# Patient Record
Sex: Female | Born: 1980 | Race: Black or African American | Hispanic: No | Smoking: Former smoker
Health system: Southern US, Community
[De-identification: ages and names within clinical notes are randomized; demographics above are authoritative.]

## PROBLEM LIST (undated history)

## (undated) DIAGNOSIS — F419 Anxiety disorder, unspecified: Secondary | ICD-10-CM

## (undated) DIAGNOSIS — T7840XA Allergy, unspecified, initial encounter: Secondary | ICD-10-CM

## (undated) DIAGNOSIS — J45909 Unspecified asthma, uncomplicated: Secondary | ICD-10-CM

## (undated) HISTORY — DX: Unspecified asthma, uncomplicated: J45.909

## (undated) HISTORY — DX: Allergy, unspecified, initial encounter: T78.40XA

## (undated) HISTORY — DX: Anxiety disorder, unspecified: F41.9

## (undated) HISTORY — PX: OTHER SURGICAL HISTORY: SHX169

---

## 1999-07-09 ENCOUNTER — Other Ambulatory Visit: Admission: RE | Admit: 1999-07-09 | Discharge: 1999-07-09 | Payer: Self-pay | Admitting: Family Medicine

## 2000-09-16 ENCOUNTER — Other Ambulatory Visit: Admission: RE | Admit: 2000-09-16 | Discharge: 2000-09-16 | Payer: Self-pay | Admitting: Family Medicine

## 2000-10-24 ENCOUNTER — Encounter: Admission: RE | Admit: 2000-10-24 | Discharge: 2000-11-14 | Payer: Self-pay | Admitting: Family Medicine

## 2005-05-25 ENCOUNTER — Other Ambulatory Visit: Admission: RE | Admit: 2005-05-25 | Discharge: 2005-05-25 | Payer: Self-pay

## 2015-07-08 ENCOUNTER — Ambulatory Visit: Payer: Self-pay | Admitting: Physical Therapy

## 2016-03-26 ENCOUNTER — Other Ambulatory Visit: Payer: Self-pay | Admitting: *Deleted

## 2016-03-26 MED ORDER — ALPRAZOLAM 0.5 MG PO TABS: 0.5000 mg | ORAL_TABLET | Freq: Two times a day (BID) | ORAL | 0 refills | Status: DC | PRN

## 2016-06-04 ENCOUNTER — Other Ambulatory Visit: Payer: Self-pay | Admitting: Physician Assistant

## 2016-07-26 ENCOUNTER — Ambulatory Visit (INDEPENDENT_AMBULATORY_CARE_PROVIDER_SITE_OTHER): Payer: No Typology Code available for payment source | Admitting: Physician Assistant

## 2016-07-26 ENCOUNTER — Encounter: Payer: Self-pay | Admitting: Physician Assistant

## 2016-07-26 VITALS — BP 117/75 | HR 72 | Temp 97.8°F | Ht 68.0 in | Wt 221.0 lb

## 2016-07-26 DIAGNOSIS — J309 Allergic rhinitis, unspecified: Secondary | ICD-10-CM

## 2016-07-26 DIAGNOSIS — B029 Zoster without complications: Secondary | ICD-10-CM | POA: Diagnosis not present

## 2016-07-26 DIAGNOSIS — F419 Anxiety disorder, unspecified: Secondary | ICD-10-CM

## 2016-07-26 MED ORDER — AZELASTINE HCL 0.1 % NA SOLN: 1.0000 | Freq: Two times a day (BID) | NASAL | 11 refills | Status: DC

## 2016-07-26 MED ORDER — ALPRAZOLAM 0.5 MG PO TABS: 0.5000 mg | ORAL_TABLET | Freq: Two times a day (BID) | ORAL | 5 refills | Status: DC

## 2016-07-26 MED ORDER — VALACYCLOVIR HCL 1 G PO TABS: 1000.0000 mg | ORAL_TABLET | Freq: Two times a day (BID) | ORAL | 0 refills | Status: DC

## 2016-07-26 MED ORDER — GABAPENTIN 100 MG PO CAPS: 100.0000 mg | ORAL_CAPSULE | Freq: Three times a day (TID) | ORAL | 3 refills | Status: DC

## 2016-07-26 MED ORDER — FLUTICASONE PROPIONATE 50 MCG/ACT NA SUSP: 2.0000 | Freq: Every day | NASAL | 11 refills | Status: DC

## 2016-07-26 NOTE — Patient Instructions (Signed)
Shingles Shingles, which is also known as herpes zoster, is an infection that causes a painful skin rash and fluid-filled blisters. Shingles is not related to genital herpes, which is a sexually transmitted infection. Shingles only develops in people who:  Have had chickenpox.  Have received the chickenpox vaccine. (This is rare.)  What are the causes? Shingles is caused by varicella-zoster virus (VZV). This is the same virus that causes chickenpox. After exposure to VZV, the virus stays in the body in an inactive (dormant) state. Shingles develops if the virus reactivates. This can happen many years after the initial exposure to VZV. It is not known what causes this virus to reactivate. What increases the risk? People who have had chickenpox or received the chickenpox vaccine are at risk for shingles. Infection is more common in people who:  Are older than age 50.  Have a weakened defense (immune) system, such as those with HIV, AIDS, or cancer.  Are taking medicines that weaken the immune system, such as transplant medicines.  Are under great stress.  What are the signs or symptoms? Early symptoms of this condition include itching, tingling, and pain in an area on your skin. Pain may be described as burning, stabbing, or throbbing. A few days or weeks after symptoms start, a painful red rash appears, usually on one side of the body in a bandlike or beltlike pattern. The rash eventually turns into fluid-filled blisters that break open, scab over, and dry up in about 2-3 weeks. At any time during the infection, you may also develop:  A fever.  Chills.  A headache.  An upset stomach.  How is this diagnosed? This condition is diagnosed with a skin exam. Sometimes, skin or fluid samples are taken from the blisters before a diagnosis is made. These samples are examined under a microscope or sent to a lab for testing. How is this treated? There is no specific cure for this condition.  Your health care provider will probably prescribe medicines to help you manage pain, recover more quickly, and avoid long-term problems. Medicines may include:  Antiviral drugs.  Anti-inflammatory drugs.  Pain medicines.  If the area involved is on your face, you may be referred to a specialist, such as an eye doctor (ophthalmologist) or an ear, nose, and throat (ENT) doctor to help you avoid eye problems, chronic pain, or disability. Follow these instructions at home: Medicines  Take medicines only as directed by your health care provider.  Apply an anti-itch or numbing cream to the affected area as directed by your health care provider. Blister and Rash Care  Take a cool bath or apply cool compresses to the area of the rash or blisters as directed by your health care provider. This may help with pain and itching.  Keep your rash covered with a loose bandage (dressing). Wear loose-fitting clothing to help ease the pain of material rubbing against the rash.  Keep your rash and blisters clean with mild soap and cool water or as directed by your health care provider.  Check your rash every day for signs of infection. These include redness, swelling, and pain that lasts or increases.  Do not pick your blisters.  Do not scratch your rash. General instructions  Rest as directed by your health care provider.  Keep all follow-up visits as directed by your health care provider. This is important.  Until your blisters scab over, your infection can cause chickenpox in people who have never had it or been vaccinated   against it. To prevent this from happening, avoid contact with other people, especially: ? Babies. ? Pregnant women. ? Children who have eczema. ? Elderly people who have transplants. ? People who have chronic illnesses, such as leukemia or AIDS. Contact a health care provider if:  Your pain is not relieved with prescribed medicines.  Your pain does not get better after  the rash heals.  Your rash looks infected. Signs of infection include redness, swelling, and pain that lasts or increases. Get help right away if:  The rash is on your face or nose.  You have facial pain, pain around your eye area, or loss of feeling on one side of your face.  You have ear pain or you have ringing in your ear.  You have loss of taste.  Your condition gets worse. This information is not intended to replace advice given to you by your health care provider. Make sure you discuss any questions you have with your health care provider. Document Released: 06/14/2005 Document Revised: 02/08/2016 Document Reviewed: 04/25/2014 Elsevier Interactive Patient Education  2017 [employer] Inc.  

## 2016-07-29 DIAGNOSIS — F419 Anxiety disorder, unspecified: Secondary | ICD-10-CM | POA: Insufficient documentation

## 2016-07-29 DIAGNOSIS — J309 Allergic rhinitis, unspecified: Secondary | ICD-10-CM | POA: Insufficient documentation

## 2016-07-29 NOTE — Progress Notes (Signed)
BP 117/75   Pulse 72   Temp 97.8 F (36.6 C) (Oral)   Ht 5\' 8"  (1.727 m)   Wt 221 lb (100.2 kg)   LMP 07/25/2016   BMI 33.60 kg/m    Subjective:    Patient ID: Gloria Carlson, female    DOB: 29-Apr-1981, 36 y.o.   MRN: [id number]  Gloria Carlson is a 36 y.o. female presenting on 07/26/2016 for Rash (On right breast-It is painful and burning )  HPI Patient here to be established as new patient at [facility].  This patient is known to me from [facility]. This patient comes in for periodic recheck on medications and conditions. All medications are reviewed today. There are no reports of any problems with the medications. All of the medical conditions are reviewed and updated.  Lab work is reviewed and will be ordered as medically necessary.   The past 2 days the patient has had an increase rash on her right chest because around to the upper back. There is severe associated pain. She has not had any shingles exposure that she is aware of.  Past Medical History:  Diagnosis Date  . Anxiety    Relevant past medical, surgical, family and social history reviewed and updated as indicated. Interim medical history since our last visit reviewed. Allergies and medications reviewed and updated.   Data reviewed from any sources in EPIC.  Review of Systems  Constitutional: Negative.   HENT: Negative.   Eyes: Negative.   Respiratory: Negative.   Gastrointestinal: Negative.   Genitourinary: Negative.   Skin: Positive for color change, rash and wound.     Social History   Social History  . Marital status: Single    Spouse name: N/A  . Number of children: N/A  . Years of education: N/A   Occupational History  . Not on file.   Social History Main Topics  . Smoking status: Former Research scientist (life sciences)  . Smokeless tobacco: Never Used  . Alcohol use Yes  . Drug use: No  . Sexual activity: Not on file   Other Topics Concern  . Not on file   Social History  Narrative  . No narrative on file    History reviewed. No pertinent surgical history.  Family History  Problem Relation Age of Onset  . Hyperlipidemia Mother   . Hypertension Father     Allergies as of 07/26/2016      Reactions   Macrodantin [nitrofurantoin Macrocrystal] Itching, Rash      Medication List       Accurate as of 07/26/16 11:59 PM. Always use your most recent med list.          ALPRAZolam 0.5 MG tablet Commonly known as:  XANAX Take 1 tablet (0.5 mg total) by mouth 2 (two) times daily.   azelastine 0.1 % nasal spray Commonly known as:  ASTELIN Place 1 spray into both nostrils 2 (two) times daily. Use in each nostril as directed   cetirizine 10 MG tablet Commonly known as:  ZYRTEC Take 10 mg by mouth daily.   fluticasone 50 MCG/ACT nasal spray Commonly known as:  FLONASE Place 2 sprays into both nostrils daily.   gabapentin 100 MG capsule Commonly known as:  NEURONTIN Take 1 capsule (100 mg total) by mouth 3 (three) times daily between meals.   valACYclovir 1000 MG tablet Commonly known as:  VALTREX Take 1 tablet (1,000 mg total) by mouth 2 (two) times daily.  Objective:    BP 117/75   Pulse 72   Temp 97.8 F (36.6 C) (Oral)   Ht 5\' 8"  (1.727 m)   Wt 221 lb (100.2 kg)   LMP 07/25/2016   BMI 33.60 kg/m   Allergies  Allergen Reactions  . Macrodantin [Nitrofurantoin Macrocrystal] Itching and Rash   Wt Readings from Last 3 Encounters:  07/26/16 221 lb (100.2 kg)    Physical Exam  Constitutional: She is oriented to person, place, and time. She appears well-developed and well-nourished.  HENT:  Head: Normocephalic and atraumatic.  Eyes: Conjunctivae and EOM are normal. Pupils are equal, round, and reactive to light.  Cardiovascular: Normal rate, regular rhythm, normal heart sounds and intact distal pulses.   Pulmonary/Chest: Effort normal and breath sounds normal.  Abdominal: Soft. Bowel sounds are normal.  Neurological: She  is alert and oriented to person, place, and time. She has normal reflexes.  Skin: Skin is warm and dry. Lesion and rash noted. Rash is vesicular. There is erythema.     Psychiatric: She has a normal mood and affect. Her behavior is normal. Judgment and thought content normal.    No results found for this or any previous visit.    Assessment & Plan:   1. Herpes zoster without complication - valACYclovir (VALTREX) 1000 MG tablet; Take 1 tablet (1,000 mg total) by mouth 2 (two) times daily.  Dispense: 20 tablet; Refill: 0 - gabapentin (NEURONTIN) 100 MG capsule; Take 1 capsule (100 mg total) by mouth 3 (three) times daily between meals.  Dispense: 90 capsule; Refill: 3  2. Allergic rhinitis, unspecified chronicity, unspecified seasonality, unspecified trigger - cetirizine (ZYRTEC) 10 MG tablet; Take 10 mg by mouth daily. - azelastine (ASTELIN) 0.1 % nasal spray; Place 1 spray into both nostrils 2 (two) times daily. Use in each nostril as directed  Dispense: 30 mL; Refill: 11 - fluticasone (FLONASE) 50 MCG/ACT nasal spray; Place 2 sprays into both nostrils daily.  Dispense: 16 g; Refill: 11  3. Anxiety - ALPRAZolam (XANAX) 0.5 MG tablet; Take 1 tablet (0.5 mg total) by mouth 2 (two) times daily.  Dispense: 30 tablet; Refill: 5   Continue all other maintenance medications as listed above. Educational handout given for shingles  Follow up plan: Return in about 6 months (around 01/23/2017), or if symptoms worsen or fail to improve, for recheck.  Terald Sleeper PA-C [place] [address]  [place], [place] [address] [phone]   07/29/2016, 7:38 PM

## 2016-11-29 ENCOUNTER — Encounter: Payer: Self-pay | Admitting: Family

## 2016-11-29 ENCOUNTER — Ambulatory Visit (INDEPENDENT_AMBULATORY_CARE_PROVIDER_SITE_OTHER): Payer: No Typology Code available for payment source | Admitting: Family

## 2016-11-29 VITALS — BP 133/86 | HR 67 | Temp 98.4°F | Ht 68.0 in | Wt 195.0 lb

## 2016-11-29 DIAGNOSIS — H9201 Otalgia, right ear: Secondary | ICD-10-CM

## 2016-11-29 DIAGNOSIS — J029 Acute pharyngitis, unspecified: Secondary | ICD-10-CM | POA: Diagnosis not present

## 2016-11-29 MED ORDER — AZITHROMYCIN 250 MG PO TABS: ORAL_TABLET | ORAL | 0 refills | Status: DC

## 2016-11-29 NOTE — Progress Notes (Signed)
Subjective:    Patient ID: Gloria Carlson, female    DOB: 05-17-1981, 36 y.o.   MRN: [id number]  Sore Throat   This is a new problem. The current episode started today. The problem has been gradually worsening. The pain is worse on the right side. There has been no fever. The pain is at a severity of 10/10. The pain is moderate. Associated symptoms include congestion, ear pain, headaches and a hoarse voice. Pertinent negatives include no coughing or ear discharge.  Otalgia   There is pain in the right ear. This is a new problem. The current episode started today. The problem occurs constantly. The problem has been gradually worsening. There has been no fever. The pain is at a severity of 10/10. Associated symptoms include headaches and hearing loss. Pertinent negatives include no coughing, ear discharge or rhinorrhea. The treatment provided no relief.      Review of Systems  HENT: Positive for congestion, ear pain, hearing loss and hoarse voice. Negative for ear discharge and rhinorrhea.   Respiratory: Negative for cough.   Neurological: Positive for headaches.  All other systems reviewed and are negative.      Objective:   Physical Exam  Constitutional: She is oriented to person, place, and time. She appears well-developed and well-nourished. No distress.  HENT:  Head: Normocephalic and atraumatic.  Right Ear: External ear normal. Tympanic membrane is not erythematous.  Left Ear: There is tenderness (extreme tendernexx). Tympanic membrane is not erythematous.  Nose: Mucosal edema and rhinorrhea present.  Mouth/Throat: Posterior oropharyngeal erythema present.  Eyes: Pupils are equal, round, and reactive to light.  Neck: Normal range of motion. Neck supple. No thyromegaly present.  Cardiovascular: Normal rate, regular rhythm, normal heart sounds and intact distal pulses.   No murmur heard. Pulmonary/Chest: Effort normal and breath sounds normal. No respiratory distress. She has no  wheezes.  Abdominal: Soft. Bowel sounds are normal. She exhibits no distension. There is no tenderness.  Musculoskeletal: Normal range of motion. She exhibits no edema or tenderness.  Neurological: She is alert and oriented to person, place, and time. She has normal reflexes. No cranial nerve deficit.  Skin: Skin is warm and dry.  Psychiatric: She has a normal mood and affect. Her behavior is normal. Judgment and thought content normal.  Vitals reviewed.     BP 133/86   Pulse 67   Temp 98.4 F (36.9 C) (Oral)   Ht 5\' 8"  (1.727 m)   Wt 195 lb (88.5 kg)   BMI 29.65 kg/m      Assessment & Plan:  1. Otalgia, right - azithromycin (ZITHROMAX) 250 MG tablet; Take 500 mg once, then 250 mg for four days  Dispense: 6 tablet; Refill: 0  2. Acute pharyngitis, unspecified etiology - Take meds as prescribed - Use a cool mist humidifier  -Use saline nose sprays frequently -Saline irrigations of the nose can be very helpful if done frequently.  * 4X daily for 1 week*  * Use of a nettie pot can be helpful with this. Follow directions with this* -Force fluids -For any cough or congestion  Use plain Mucinex- regular strength or max strength is fine   * Children- consult with Pharmacist for dosing -For fever or aces or pains- take tylenol or ibuprofen appropriate for age and weight.  * for fevers greater than 101 orally you may alternate ibuprofen and tylenol every  3 hours. -Throat lozenges if help -New toothbrush in 3 days - azithromycin (ZITHROMAX) 250 MG  tablet; Take 500 mg once, then 250 mg for four days  Dispense: 6 tablet; Refill: 0  Continue Zyrtec and flonase   Evelina Dun, FNP

## 2016-11-29 NOTE — Patient Instructions (Signed)

## 2017-01-25 ENCOUNTER — Ambulatory Visit: Payer: No Typology Code available for payment source | Admitting: Physician Assistant

## 2017-02-02 ENCOUNTER — Ambulatory Visit (INDEPENDENT_AMBULATORY_CARE_PROVIDER_SITE_OTHER): Payer: No Typology Code available for payment source | Admitting: Physician Assistant

## 2017-02-02 ENCOUNTER — Encounter: Payer: Self-pay | Admitting: Physician Assistant

## 2017-02-02 VITALS — BP 119/77 | HR 71 | Temp 98.7°F | Ht 68.0 in | Wt 197.0 lb

## 2017-02-02 DIAGNOSIS — T63444A Toxic effect of venom of bees, undetermined, initial encounter: Secondary | ICD-10-CM | POA: Diagnosis not present

## 2017-02-02 DIAGNOSIS — S40862A Insect bite (nonvenomous) of left upper arm, initial encounter: Secondary | ICD-10-CM

## 2017-02-02 DIAGNOSIS — J309 Allergic rhinitis, unspecified: Secondary | ICD-10-CM

## 2017-02-02 MED ORDER — EPINEPHRINE 0.3 MG/0.3ML IJ SOAJ: 0.3000 mg | Freq: Once | INTRAMUSCULAR | 5 refills | Status: AC

## 2017-02-02 MED ORDER — MONTELUKAST SODIUM 10 MG PO TABS: 10.0000 mg | ORAL_TABLET | Freq: Every day | ORAL | 11 refills | Status: DC

## 2017-02-02 MED ORDER — METHYLPREDNISOLONE ACETATE 80 MG/ML IJ SUSP
80.0000 mg | Freq: Once | INTRAMUSCULAR | Status: AC
  Administered 2017-02-02: 80 mg via INTRAMUSCULAR

## 2017-02-02 MED ORDER — PREDNISONE 10 MG (21) PO TBPK: ORAL_TABLET | ORAL | 0 refills | Status: DC

## 2017-02-02 NOTE — Patient Instructions (Signed)
Bee, Wasp, or [employer], Adult Bees, wasps, and hornets are part of a family of insects that can sting people. These stings can cause pain and inflammation, but they are usually not serious. However, some people may have an allergic reaction to a sting. This can cause the symptoms to be more severe. What increases the risk? You may be at a greater risk of getting stung if you:  Provoke a stinging insect by swatting or disturbing it.  Wear strong-smelling soaps, deodorants, or body sprays.  Spend time outdoors near gardens with flowers or fruit trees or in clothes that expose skin.  Eat or drink outside.  What are the signs or symptoms? Common symptoms of this condition include:  A red lump in the skin that sometimes has a tiny hole in the center. In some cases, a stinger may be in the center of the wound.  Pain and itching at the sting site.  Redness and swelling around the sting site. If you have an allergic reaction (localized allergic reaction), the swelling and redness may spread out from the sting site. In some cases, this reaction can continue to develop over the next 24-48 hours.  In rare cases, a person may have a severe allergic reaction (anaphylactic reaction) to a sting. Symptoms of an anaphylactic reaction may include:  Wheezing or difficulty breathing.  Raised, itchy, red patches on the skin (hives).  Nausea or vomiting.  Abdominal cramping.  Diarrhea.  Tightness in the chest or chest pain.  Dizziness or fainting.  Redness of the face (flushing).  Hoarse voice.  Swollen tongue, lips, or face.  How is this diagnosed? This condition is usually diagnosed based on your symptoms and medical history as well as a physical exam. You may have an allergy test to determine if you are allergic to the substance that the insect injected during the sting (venom). How is this treated? If you were stung by a bee, the stinger and a small sac of venom may be in the wound.  It is important to remove the stinger as soon as possible. You can do this by brushing across the wound with gauze, a fingernail, or a flat card such as a credit card. Removing the stinger can help reduce the severity of your body's reaction to the sting. Most stings can be treated with:  Icing to reduce swelling in the area.  Medicines (antihistamines) to treat itching or an allergic reaction.  Medicines to help reduce pain. These may be medicines that you take by mouth, or medicated creams or lotions that you apply to your skin.  Pay close attention to your symptoms after you have been stung. If possible, have someone stay with you to make sure you do not have an allergic reaction. If you have any signs of an allergic reaction, call your health care provider. If you have ever had a severe allergic reaction, your health care provider may give you an inhaler or injectable medicine (epinephrine auto-injector) to use if necessary. Follow these instructions at home:  Wash the sting site 2-3 times each day with soap and water as told by your health care provider.  Apply or take over-the-counter and prescription medicines only as told by your health care provider.  If directed, apply ice to the sting area. ? Put ice in a plastic bag. ? Place a towel between your skin and the bag. ? Leave the ice on for 20 minutes, 2-3 times a day.  Do not scratch the sting  area.  If you had a severe allergic reaction to a sting, you may need: ? To wear a medical bracelet or necklace that lists the allergy. ? To learn when and how to use an anaphylaxis kit or epinephrine injection. Your family members and coworkers may also need to learn this. ? To carry an anaphylaxis kit or epinephrine injection with you at all times. How is this prevented?  Avoid swatting at stinging insects and disturbing insect nests.  Do not use fragrant soaps or lotions.  Wear shoes, pants, and long sleeves when spending time  outdoors, especially in grassy areas where stinging insects are common.  Keep outdoor areas free from nests or hives.  Keep food and drink containers covered when eating outdoors.  Avoid working or sitting near flowering plants, if possible.  Wear gloves if you are gardening or working outdoors.  If an attack by a stinging insect or a swarm seems likely in the moment, move away from the area or find a barrier between you and the insect(s), such as a door. Contact a health care provider if:  Your symptoms do not get better in 2-3 days.  You have redness, swelling, or pain that spreads beyond the area of the sting.  You have a fever. Get help right away if: You have symptoms of a severe allergic reaction. These include:  Wheezing or difficulty breathing.  Tightness in the chest or chest pain.  Light-headedness or fainting.  Itchy, raised, red patches on the skin.  Nausea or vomiting.  Abdominal cramping.  Diarrhea.  A swollen tongue or lips, or trouble swallowing.  Dizziness or fainting.  Summary  Stings from bees, wasps, and hornets can cause pain and inflammation, but they are usually not serious. However, some people may have an allergic reaction to a sting. This can cause the symptoms to be more severe.  Pay close attention to your symptoms after you have been stung. If possible, have someone stay with you to make sure you do not have an allergic reaction.  Call your health care provider if you have any signs of an allergic reaction. This information is not intended to replace advice given to you by your health care provider. Make sure you discuss any questions you have with your health care provider. Document Released: 06/14/2005 Document Revised: 08/19/2016 Document Reviewed: 08/19/2016 Elsevier Interactive Patient Education  2018 [employer].  

## 2017-02-04 DIAGNOSIS — T63441A Toxic effect of venom of bees, accidental (unintentional), initial encounter: Secondary | ICD-10-CM | POA: Insufficient documentation

## 2017-02-04 NOTE — Progress Notes (Signed)
BP 119/77   Pulse 71   Temp 98.7 F (37.1 C) (Oral)   Ht 5\' 8"  (1.727 m)   Wt 197 lb (89.4 kg)   BMI 29.95 kg/m    Subjective:    Patient ID: Gloria Carlson, female    DOB: 1981-01-20, 36 y.o.   MRN: [id number]  HPI: Gloria Carlson is a 36 y.o. female presenting on 02/02/2017 for Insect Bite (left arm )  Patient was stung one day ago by a wasp. She has had swelling and warmth in the area of the sting. It has not started itching yet. She denies any fever or chills. There is no shortness of breath.  The patient has chronic allergic rhinitis. She reports that she was seen by an allergist many years ago. At that time she EpiPen because she did have an anaphylactic reaction to some mold and mildew. When she had her allergy testing she had multiple allergens but cannot recall them all.  Relevant past medical, surgical, family and social history reviewed and updated as indicated. Allergies and medications reviewed and updated.  Past Medical History:  Diagnosis Date  . Anxiety     History reviewed. No pertinent surgical history.  Review of Systems  Constitutional: Negative.   HENT: Negative.   Eyes: Negative.   Respiratory: Negative.   Gastrointestinal: Negative.   Genitourinary: Negative.   Skin: Positive for color change, rash and wound.    Allergies as of 02/02/2017      Reactions   Macrodantin [nitrofurantoin Macrocrystal] Itching, Rash      Medication List       Accurate as of 02/02/17 11:59 PM. Always use your most recent med list.          ALPRAZolam 0.5 MG tablet Commonly known as:  XANAX Take 1 tablet (0.5 mg total) by mouth 2 (two) times daily.   azelastine 0.1 % nasal spray Commonly known as:  ASTELIN Place 1 spray into both nostrils 2 (two) times daily. Use in each nostril as directed   cetirizine 10 MG tablet Commonly known as:  ZYRTEC Take 10 mg by mouth daily.   EPINEPHrine 0.3 mg/0.3 mL Soaj injection Commonly known as:  EPIPEN 2-PAK Inject 0.3 mLs  (0.3 mg total) into the muscle once.   fluticasone 50 MCG/ACT nasal spray Commonly known as:  FLONASE Place 2 sprays into both nostrils daily.   montelukast 10 MG tablet Commonly known as:  SINGULAIR Take 1 tablet (10 mg total) by mouth at bedtime.   predniSONE 10 MG (21) Tbpk tablet Commonly known as:  STERAPRED UNI-PAK 21 TAB 6 day pack, take as directed          Objective:    BP 119/77   Pulse 71   Temp 98.7 F (37.1 C) (Oral)   Ht 5\' 8"  (1.727 m)   Wt 197 lb (89.4 kg)   BMI 29.95 kg/m   Allergies  Allergen Reactions  . Macrodantin [Nitrofurantoin Macrocrystal] Itching and Rash    Physical Exam  Constitutional: She is oriented to person, place, and time. She appears well-developed and well-nourished.  HENT:  Head: Normocephalic and atraumatic.  Eyes: Pupils are equal, round, and reactive to light. Conjunctivae and EOM are normal.  Cardiovascular: Normal rate, regular rhythm, normal heart sounds and intact distal pulses.   Pulmonary/Chest: Effort normal and breath sounds normal.  Abdominal: Soft. Bowel sounds are normal.  Musculoskeletal:       Arms: Sting bite with associated raised red warm lesion. It  is confluent around the left upper arm. There is no red streaking.  Neurological: She is alert and oriented to person, place, and time. She has normal reflexes.  Skin: Skin is warm and dry. No rash noted.  Psychiatric: She has a normal mood and affect. Her behavior is normal. Judgment and thought content normal.    No results found for this or any previous visit.    Assessment & Plan:   1. Bee sting, undetermined intent, initial encounter - predniSONE (STERAPRED UNI-PAK 21 TAB) 10 MG (21) TBPK tablet; 6 day pack, take as directed  Dispense: 21 tablet; Refill: 0 - methylPREDNISolone acetate (DEPO-MEDROL) injection 80 mg; Inject 1 mL (80 mg total) into the muscle once.  2. Allergic rhinitis, unspecified seasonality, unspecified trigger - montelukast  (SINGULAIR) 10 MG tablet; Take 1 tablet (10 mg total) by mouth at bedtime.  Dispense: 30 tablet; Refill: 11 - EPINEPHrine (EPIPEN 2-PAK) 0.3 mg/0.3 mL IJ SOAJ injection; Inject 0.3 mLs (0.3 mg total) into the muscle once.  Dispense: 1 Device; Refill: 5    Current Outpatient Prescriptions:  .  ALPRAZolam (XANAX) 0.5 MG tablet, Take 1 tablet (0.5 mg total) by mouth 2 (two) times daily., Disp: 30 tablet, Rfl: 5 .  azelastine (ASTELIN) 0.1 % nasal spray, Place 1 spray into both nostrils 2 (two) times daily. Use in each nostril as directed, Disp: 30 mL, Rfl: 11 .  cetirizine (ZYRTEC) 10 MG tablet, Take 10 mg by mouth daily., Disp: , Rfl:  .  fluticasone (FLONASE) 50 MCG/ACT nasal spray, Place 2 sprays into both nostrils daily., Disp: 16 g, Rfl: 11 .  montelukast (SINGULAIR) 10 MG tablet, Take 1 tablet (10 mg total) by mouth at bedtime., Disp: 30 tablet, Rfl: 11 .  predniSONE (STERAPRED UNI-PAK 21 TAB) 10 MG (21) TBPK tablet, 6 day pack, take as directed, Disp: 21 tablet, Rfl: 0 Continue all other maintenance medications as listed above.  Follow up plan: Return if symptoms worsen or fail to improve.  Educational handout given for be sting  Terald Sleeper PA-C [facility] [address]  [place], [place] [address] [phone]   02/04/2017, 12:56 PM

## 2017-03-07 ENCOUNTER — Encounter: Payer: Self-pay | Admitting: Family Medicine

## 2017-03-07 ENCOUNTER — Ambulatory Visit (INDEPENDENT_AMBULATORY_CARE_PROVIDER_SITE_OTHER): Payer: No Typology Code available for payment source | Admitting: Family Medicine

## 2017-03-07 VITALS — BP 91/61 | HR 65 | Temp 98.2°F | Ht 68.0 in | Wt 197.0 lb

## 2017-03-07 DIAGNOSIS — R399 Unspecified symptoms and signs involving the genitourinary system: Secondary | ICD-10-CM | POA: Diagnosis not present

## 2017-03-07 LAB — URINALYSIS
Bilirubin, UA: NEGATIVE
GLUCOSE, UA: NEGATIVE
Ketones, UA: NEGATIVE
Leukocytes, UA: NEGATIVE
Nitrite, UA: NEGATIVE
PROTEIN UA: NEGATIVE
Specific Gravity, UA: 1.015 (ref 1.005–1.030)
Urobilinogen, Ur: 0.2 mg/dL (ref 0.2–1.0)
pH, UA: 7.5 (ref 5.0–7.5)

## 2017-03-07 MED ORDER — SULFAMETHOXAZOLE-TRIMETHOPRIM 800-160 MG PO TABS: 1.0000 | ORAL_TABLET | Freq: Two times a day (BID) | ORAL | 0 refills | Status: DC

## 2017-03-07 NOTE — Progress Notes (Signed)
Chief Complaint  Patient presents with  . Urinary Frequency    pt is here today c/o frequent urination, flank pain and a different odor to her unine    HPI  Patient presents today  with urinary frequency for several days. Denies fever . Moderate bilateral flank pain. No nausea, vomiting. Denies vaginal DC. Has noted a musky odor from her urine.   PMH: Smoking status noted ROS: Per HPI  Objective: BP 91/61   Pulse 65   Temp 98.2 F (36.8 C) (Oral)   Ht 5\' 8"  (1.727 m)   Wt 197 lb (89.4 kg)   BMI 29.95 kg/m  Gen: NAD, alert, cooperative with exam HEENT: NCAT, EOMI, PERRL  CV: RRR, good S1/S2, no murmur Resp: CTABL, no wheezes, non-labored Abd: SNTND, BS present, no guarding or organomegaly. Flanks nontender Ext: No edema, warm Neuro: Alert and oriented, No gross deficits  Assessment and plan:  1. UTI symptoms     Meds ordered this encounter  Medications  . sulfamethoxazole-trimethoprim (BACTRIM DS,SEPTRA DS) 800-160 MG tablet    Sig: Take 1 tablet by mouth 2 (two) times daily.    Dispense:  14 tablet    Refill:  0    Orders Placed This Encounter  Procedures  . Urine Culture  . Urinalysis    Follow up as needed.  Claretta Fraise, MD

## 2017-03-09 LAB — URINE CULTURE

## 2017-04-13 ENCOUNTER — Telehealth: Payer: Self-pay | Admitting: Physician Assistant

## 2017-04-13 NOTE — Telephone Encounter (Signed)
wants names of places she can go for consoling. Please advise.

## 2017-04-13 NOTE — Telephone Encounter (Signed)
Your provider wants you to schedule an appointment with a Psychologist/Psychiatrist. The following list of offices requires the patient to call and make their own appointment, as there is information they need that only you can provide. Please feel free to choose form the following providers:  [employer] in [place]  [facility]  [phone] [place], [place]  (Scheduled through [place]) Must call and do an interview for appointment. Sees Children / Accepts Medicaid  [facility] in [place]  [address] [place]    [place], [place]  [phone] [address] [place], [place] for Autism but does not treat it Sees Children / Accepts Medicaid  [facility]    [phone] [address]   [place], [place] Medication management, substance abuse, bipolar, grief, family, marriage, OCD, anxiety, PTSD Sees children / Accepts Medicaid  [facility]    [phone] [address], [place], [place] children / Accepts [facility]  [facility]  [phone] [place], [facility]   [phone] [address], [place] children as young as 64 years old Accepts [facility]     [phone]    [place], [place] [address] Sees children Accepts Medicaid

## 2017-04-13 NOTE — Telephone Encounter (Signed)
lmtcb

## 2017-04-19 NOTE — Telephone Encounter (Signed)
List of providers mailed to patient

## 2017-07-30 ENCOUNTER — Other Ambulatory Visit: Payer: Self-pay | Admitting: Physician Assistant

## 2017-07-30 DIAGNOSIS — F419 Anxiety disorder, unspecified: Secondary | ICD-10-CM

## 2017-08-15 ENCOUNTER — Ambulatory Visit (INDEPENDENT_AMBULATORY_CARE_PROVIDER_SITE_OTHER): Payer: No Typology Code available for payment source | Admitting: Family Medicine

## 2017-08-15 ENCOUNTER — Encounter: Payer: Self-pay | Admitting: Family Medicine

## 2017-08-15 VITALS — BP 135/97 | HR 75 | Temp 98.8°F | Ht 68.0 in | Wt 195.8 lb

## 2017-08-15 DIAGNOSIS — J029 Acute pharyngitis, unspecified: Secondary | ICD-10-CM | POA: Diagnosis not present

## 2017-08-15 DIAGNOSIS — J40 Bronchitis, not specified as acute or chronic: Secondary | ICD-10-CM | POA: Diagnosis not present

## 2017-08-15 LAB — CULTURE, GROUP A STREP

## 2017-08-15 LAB — RAPID STREP SCREEN (MED CTR MEBANE ONLY): STREP GP A AG, IA W/REFLEX: NEGATIVE

## 2017-08-15 MED ORDER — AZITHROMYCIN 250 MG PO TABS: ORAL_TABLET | ORAL | 0 refills | Status: DC

## 2017-08-15 MED ORDER — BENZONATATE 200 MG PO CAPS: 200.0000 mg | ORAL_CAPSULE | Freq: Two times a day (BID) | ORAL | 0 refills | Status: DC | PRN

## 2017-08-15 NOTE — Progress Notes (Signed)
Subjective: CC: congested PCP: Terald Sleeper, PA-C [facility] Gloria Carlson is a 37 y.o. female presenting to clinic today for:  1. Cold symptoms  Patient reports deep cough with brown/green mucus and sore throat that started 1 week ago.  She reports that she has been gargling with vinegar and water, using Mucinex and over-the-counter cough medications with little improvement in symptoms.  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  She has been tolerating oral intake fairly well.  She recently stopped smoking about 3 weeks ago.  she notes rhinorrhea and postnasal drip.  She is currently on Astelin, Flonase, Singulair for symptoms.  Denies hemoptysis, SOB, dizziness, rash, nausea, vomiting, diarrhea, fevers, chills, myalgia, sick contacts, recent travel.  No history of COPD.  She notes a history of asthma as a child but she is since then outgrown this.  No recent tobacco use/ exposure.  Again, she stopped smoking about 3 weeks ago.  ROS: Per HPI  Allergies  Allergen Reactions  . Macrodantin [Nitrofurantoin Macrocrystal] Itching and Rash   Past Medical History:  Diagnosis Date  . Anxiety     Current Outpatient Medications:  .  ALPRAZolam (XANAX) 0.5 MG tablet, Take 1 tablet (0.5 mg total) by mouth 2 (two) times daily., Disp: 30 tablet, Rfl: 5 .  azelastine (ASTELIN) 0.1 % nasal spray, Place 1 spray into both nostrils 2 (two) times daily. Use in each nostril as directed, Disp: 30 mL, Rfl: 11 .  cetirizine (ZYRTEC) 10 MG tablet, Take 10 mg by mouth daily., Disp: , Rfl:  .  fluticasone (FLONASE) 50 MCG/ACT nasal spray, Place 2 sprays into both nostrils daily., Disp: 16 g, Rfl: 11 .  montelukast (SINGULAIR) 10 MG tablet, Take 1 tablet (10 mg total) by mouth at bedtime., Disp: 30 tablet, Rfl: 11 Social History   Socioeconomic History  . Marital status: Single    Spouse name: Not on file  . Number of children: Not on file  . Years of education: Not on file  . Highest  education level: Not on file  Social Needs  . Financial resource strain: Not on file  . Food insecurity - worry: Not on file  . Food insecurity - inability: Not on file  . Transportation needs - medical: Not on file  . Transportation needs - non-medical: Not on file  Occupational History  . Not on file  Tobacco Use  . Smoking status: Former Research scientist (life sciences)  . Smokeless tobacco: Never Used  Substance and Sexual Activity  . Alcohol use: Yes  . Drug use: No  . Sexual activity: Not on file  Other Topics Concern  . Not on file  Social History Narrative  . Not on file   Family History  Problem Relation Age of Onset  . Hyperlipidemia Mother   . Hypertension Father     Objective: Office vital signs reviewed. BP (!) 135/97   Pulse 75   Temp 98.8 F (37.1 C) (Oral)   Ht 5\' 8"  (1.727 m)   Wt 195 lb 12.8 oz (88.8 kg)   BMI 29.77 kg/m   Physical Examination:  General: Awake, alert, well nourished, nontoxic appearing, No acute distress HEENT: Normal    Neck: No masses palpated. No lymphadenopathy    Ears: Tympanic membranes intact, normal light reflex, no erythema, no bulging    Eyes: PERRLA, extraocular membranes intact, sclera white, no ocular discharge    Nose: nasal turbinates moist, clear nasal discharge    Throat: moist mucus membranes, no  erythema, no tonsillar exudate.  Airway is patent Cardio: regular rate and rhythm, S1S2 heard, no murmurs appreciated Pulm: clear to auscultation bilaterally, no wheezes, rhonchi or rales; normal work of breathing on room air  Assessment/ Plan: 37 y.o. female   1. Bronchitis Given smoking history and symptoms, will cover with antibiotics and Tessalon Perles for what I suspect is bronchitis.  She is afebrile and nontoxic-appearing on today's exam.  Home care instructions were reviewed with patient.  Handout was provided.  Work note provided excusing for the next 2 days. Strict return precautions and reasons for emergent evaluation in the  emergency department review with patient.  They voiced understanding and will follow-up as needed.  2. Sore throat Strep was negative. - Rapid Strep Screen (Not at [facility])   Orders Placed This Encounter  Procedures  . Rapid Strep Screen (Not at [facility])   Meds ordered this encounter  Medications  . azithromycin (ZITHROMAX Z-PAK) 250 MG tablet    Sig: As directed    Dispense:  6 tablet    Refill:  0  . benzonatate (TESSALON) 200 MG capsule    Sig: Take 1 capsule (200 mg total) by mouth 2 (two) times daily as needed for cough.    Dispense:  20 capsule    Refill:  [place], DO [place] [phone]

## 2017-08-15 NOTE — Patient Instructions (Signed)
I am treating as a bronchitis given you a report of symptoms.  I have prescribed you a Z-Pak to use for the next 5 days.  I have also prescribed you Tessalon Perles to use twice a day if needed for cough.  You have enough for 10 days but will likely not needed for that long.  Continue to push oral fluids.  You may continue your other prescribed medications while on these medicines.  - Get plenty of rest and drink plenty of fluids. - Try to breathe moist air. Use a cold mist humidifier. - Consume warm fluids (soup or tea) to provide relief for a stuffy nose and to loosen phlegm. - For nasal stuffiness, try saline nasal spray or a Neti Pot. Afrin nasal spray can also be used but this product should not be used longer than 3 days or it will cause rebound nasal stuffiness (worsening nasal congestion). - For sore throat pain relief: suck on throat lozenges, hard candy or popsicles; gargle with warm salt water (1/4 tsp. salt per 8 oz. of water); and eat soft, bland foods. - Eat a well-balanced diet. If you cannot, ensure you are getting enough nutrients by taking a daily multivitamin. - Avoid dairy products, as they can thicken phlegm. - Avoid alcohol, as it impairs your body's immune system.   Acute Bronchitis, Adult Acute bronchitis is when air tubes (bronchi) in the lungs suddenly get swollen. The condition can make it hard to breathe. It can also cause these symptoms:  A cough.  Coughing up clear, yellow, or green mucus.  Wheezing.  Chest congestion.  Shortness of breath.  A fever.  Body aches.  Chills.  A sore throat.  Follow these instructions at home: Medicines  Take over-the-counter and prescription medicines only as told by your doctor.  If you were prescribed an antibiotic medicine, take it as told by your doctor. Do not stop taking the antibiotic even if you start to feel better. General instructions  Rest.  Drink enough fluids to keep your pee (urine) clear or pale  yellow.  Avoid smoking and secondhand smoke. If you smoke and you need help quitting, ask your doctor. Quitting will help your lungs heal faster.  Use an inhaler, cool mist vaporizer, or humidifier as told by your doctor.  Keep all follow-up visits as told by your doctor. This is important. How is this prevented? To lower your risk of getting this condition again:  Wash your hands often with soap and water. If you cannot use soap and water, use hand sanitizer.  Avoid contact with people who have cold symptoms.  Try not to touch your hands to your mouth, nose, or eyes.  Make sure to get the flu shot every year.  Contact a doctor if:  Your symptoms do not get better in 2 weeks. Get help right away if:  You cough up blood.  You have chest pain.  You have very bad shortness of breath.  You become dehydrated.  You faint (pass out) or keep feeling like you are going to pass out.  You keep throwing up (vomiting).  You have a very bad headache.  Your fever or chills gets worse. This information is not intended to replace advice given to you by your health care provider. Make sure you discuss any questions you have with your health care provider. Document Released: 12/01/2007 Document Revised: 01/21/2016 Document Reviewed: 12/03/2015 Elsevier Interactive Patient Education  [employer].

## 2017-11-26 ENCOUNTER — Other Ambulatory Visit: Payer: Self-pay | Admitting: Physician Assistant

## 2017-11-26 DIAGNOSIS — F419 Anxiety disorder, unspecified: Secondary | ICD-10-CM

## 2017-11-28 NOTE — Telephone Encounter (Signed)
Last seen 08/15/17  [facility]

## 2018-01-14 ENCOUNTER — Other Ambulatory Visit: Payer: Self-pay | Admitting: Physician Assistant

## 2018-01-14 DIAGNOSIS — F419 Anxiety disorder, unspecified: Secondary | ICD-10-CM

## 2018-01-14 NOTE — Telephone Encounter (Signed)
Last seen 08/15/16

## 2018-02-20 ENCOUNTER — Other Ambulatory Visit: Payer: Self-pay | Admitting: Physician Assistant

## 2018-02-20 DIAGNOSIS — F419 Anxiety disorder, unspecified: Secondary | ICD-10-CM

## 2018-06-03 ENCOUNTER — Other Ambulatory Visit: Payer: Self-pay | Admitting: Physician Assistant

## 2018-06-03 DIAGNOSIS — F419 Anxiety disorder, unspecified: Secondary | ICD-10-CM

## 2018-12-12 ENCOUNTER — Telehealth: Payer: Self-pay | Admitting: Physician Assistant

## 2018-12-12 NOTE — Telephone Encounter (Signed)
Patient aware that she would have to do a virtual visit with our office to be tested through [facility]. Patient given information on [facility] as well.

## 2019-02-27 ENCOUNTER — Other Ambulatory Visit: Payer: Self-pay

## 2019-02-27 DIAGNOSIS — Z20822 Contact with and (suspected) exposure to covid-19: Secondary | ICD-10-CM

## 2019-03-01 LAB — NOVEL CORONAVIRUS, NAA: SARS-CoV-2, NAA: DETECTED — AB

## 2019-03-13 ENCOUNTER — Other Ambulatory Visit: Payer: Self-pay

## 2019-03-13 DIAGNOSIS — Z20822 Contact with and (suspected) exposure to covid-19: Secondary | ICD-10-CM

## 2019-03-15 LAB — NOVEL CORONAVIRUS, NAA: SARS-CoV-2, NAA: NOT DETECTED

## 2020-03-10 ENCOUNTER — Ambulatory Visit: Payer: No Typology Code available for payment source | Admitting: Family Medicine

## 2020-03-10 ENCOUNTER — Other Ambulatory Visit: Payer: Self-pay

## 2020-03-10 ENCOUNTER — Encounter: Payer: Self-pay | Admitting: Family Medicine

## 2020-03-10 VITALS — BP 114/79 | HR 70 | Temp 98.4°F | Ht 68.0 in | Wt 279.0 lb

## 2020-03-10 DIAGNOSIS — Z0000 Encounter for general adult medical examination without abnormal findings: Secondary | ICD-10-CM

## 2020-03-10 DIAGNOSIS — Z1211 Encounter for screening for malignant neoplasm of colon: Secondary | ICD-10-CM

## 2020-03-10 DIAGNOSIS — Z13228 Encounter for screening for other metabolic disorders: Secondary | ICD-10-CM

## 2020-03-10 DIAGNOSIS — Z13220 Encounter for screening for lipoid disorders: Secondary | ICD-10-CM | POA: Diagnosis not present

## 2020-03-10 DIAGNOSIS — Z1329 Encounter for screening for other suspected endocrine disorder: Secondary | ICD-10-CM

## 2020-03-10 DIAGNOSIS — F419 Anxiety disorder, unspecified: Secondary | ICD-10-CM

## 2020-03-10 DIAGNOSIS — Z0184 Encounter for antibody response examination: Secondary | ICD-10-CM

## 2020-03-10 DIAGNOSIS — Z0001 Encounter for general adult medical examination with abnormal findings: Secondary | ICD-10-CM

## 2020-03-10 DIAGNOSIS — E66813 Obesity, class 3: Secondary | ICD-10-CM

## 2020-03-10 DIAGNOSIS — Z130 Encounter for screening for diseases of the blood and blood-forming organs and certain disorders involving the immune mechanism: Secondary | ICD-10-CM

## 2020-03-10 MED ORDER — BUSPIRONE HCL 5 MG PO TABS
5.0000 mg | ORAL_TABLET | Freq: Three times a day (TID) | ORAL | 0 refills | Status: AC
Start: 2020-03-10 — End: 2020-06-08

## 2020-03-10 NOTE — Progress Notes (Addendum)
New Patient Office Visit  Subjective:  Patient ID: Gloria Carlson, female    DOB: [birth year]  Age: 39 y.o. MRN: [id number]  CC:  Chief Complaint  Patient presents with  . Annual Exam    HPI Gloria Carlson presents for an annual exam for school.  Gloria Carlson is starting a phlebotomy program and is required to be seen for a physical. She also needs a varicella titer completed today. She has a few concerns she would like to talk about today.    1. Anxiety She reports a history of anxiety which she has taken Xanax for in the past. She reports symptoms are generally controlled but feels increased heart rate, "wound up", and anxious 1-2x a weeks sometimes. She is not interested in a controller medication today. She has a history of panic attacks.   2. Obesity She is interested in weight loss medications. She reports her diet is "not good" and consists of a lot of fast food due to a busy schedule. She would weight loss medication to help jump start her weight loss and motivate her. She does walk for 30-45 mins a day 3x a week.  3. Colon cancer screening She reports her mother was diagnosed with cancerous polyps at the age of 4 and is interested in a colonoscopy.  4. Breast cancer screening See GYN for paps/breat exams. Occasionally does self breast exams at home. Her great grandmother had breast cancer. She would like to defer a mammogram at this time. Defer breast exam today as well.  Past Medical History:  Diagnosis Date  . Anxiety     History reviewed. No pertinent surgical history.  Family History  Problem Relation Age of Onset  . Hyperlipidemia Mother   . Hypertension Father     Social History   Socioeconomic History  . Marital status: Single    Spouse name: Not on file  . Number of children: Not on file  . Years of education: Not on file  . Highest education level: Not on file  Occupational History  . Not on file  Tobacco Use  . Smoking status: Former Research scientist (life sciences)  .  Smokeless tobacco: Never Used  Vaping Use  . Vaping Use: Never used  Substance and Sexual Activity  . Alcohol use: Yes  . Drug use: No  . Sexual activity: Not on file  Other Topics Concern  . Not on file  Social History Narrative  . Not on file   Social Determinants of Health   Financial Resource Strain:   . Difficulty of Paying Living Expenses: Not on file  Food Insecurity:   . Worried About Charity fundraiser in the Last Year: Not on file  . Ran Out of Food in the Last Year: Not on file  Transportation Needs:   . Lack of Transportation (Medical): Not on file  . Lack of Transportation (Non-Medical): Not on file  Physical Activity:   . Days of Exercise per Week: Not on file  . Minutes of Exercise per Session: Not on file  Stress:   . Feeling of Stress : Not on file  Social Connections:   . Frequency of Communication with Friends and Family: Not on file  . Frequency of Social Gatherings with Friends and Family: Not on file  . Attends Religious Services: Not on file  . Active Member of Clubs or Organizations: Not on file  . Attends Archivist Meetings: Not on file  . Marital Status: Not on file  Intimate Partner  Violence:   . Fear of Current or Ex-Partner: Not on file  . Emotionally Abused: Not on file  . Physically Abused: Not on file  . Sexually Abused: Not on file    ROS Review of Systems  Constitutional: Negative for chills, fatigue, fever and unexpected weight change.  HENT: Negative for congestion, sore throat and trouble swallowing.   Eyes: Negative for photophobia and visual disturbance.  Respiratory: Negative for cough, chest tightness and shortness of breath.   Cardiovascular: Negative for chest pain and leg swelling.  Gastrointestinal: Negative for abdominal pain, blood in stool, diarrhea, nausea and vomiting.  Endocrine: Negative for cold intolerance, heat intolerance, polydipsia, polyphagia and polyuria.  Genitourinary: Negative for difficulty  urinating, dysuria and menstrual problem.  Musculoskeletal: Negative for arthralgias and myalgias.  Skin: Negative for rash.  Neurological: Negative for dizziness and weakness.  Psychiatric/Behavioral: Negative for agitation. The patient is nervous/anxious.     Objective:   Today's Vitals: BP 114/79   Pulse 70   Temp 98.4 F (36.9 C) (Temporal)   Ht 5\' 8"  (1.727 m)   Wt 279 lb (126.6 kg)   SpO2 100%   BMI 42.42 kg/m    Depression screen [facility] 2/9 03/10/2020 08/15/2017 02/02/2017 11/29/2016 07/26/2016  Decreased Interest 0 0 0 0 0  Down, Depressed, Hopeless 0 0 0 0 0  PHQ - 2 Score 0 0 0 0 0  Altered sleeping 1 - - - -  Tired, decreased energy 0 - - - -  Change in appetite 0 - - - -  Feeling bad or failure about yourself  0 - - - -  Trouble concentrating 1 - - - -  Moving slowly or fidgety/restless 0 - - - -  Suicidal thoughts 0 - - - -  PHQ-9 Score 2 - - - -  Difficult doing work/chores Not difficult at all - - - -   GAD 7 : Generalized Anxiety Score 03/10/2020  Nervous, Anxious, on Edge 1  Control/stop worrying 0  Worry too much - different things 0  Trouble relaxing 0  Restless 0  Easily annoyed or irritable 1  Afraid - awful might happen 1  Total GAD 7 Score 3    Physical Exam Vitals and nursing note reviewed.  Constitutional:      General: She is not in acute distress.    Appearance: She is not ill-appearing.  HENT:     Head: Normocephalic and atraumatic.     Right Ear: Tympanic membrane, ear canal and external ear normal.     Left Ear: Tympanic membrane, ear canal and external ear normal.     Nose: Nose normal.     Mouth/Throat:     Mouth: Mucous membranes are moist.     Pharynx: Oropharynx is clear.  Eyes:     Extraocular Movements: Extraocular movements intact.     Conjunctiva/sclera: Conjunctivae normal.     Pupils: Pupils are equal, round, and reactive to light.  Neck:     Vascular: No carotid bruit.  Cardiovascular:     Rate and Rhythm: Normal rate and  regular rhythm.     Pulses: Normal pulses.     Heart sounds: Normal heart sounds. No murmur heard.   Pulmonary:     Effort: Pulmonary effort is normal. No respiratory distress.     Breath sounds: Normal breath sounds.  Abdominal:     General: Bowel sounds are normal. There is no distension.     Palpations: Abdomen is soft. There  is no mass.     Tenderness: There is no abdominal tenderness. There is no guarding or rebound.  Genitourinary:    Comments: Deferred to GYN Musculoskeletal:        General: Normal range of motion.     Cervical back: Normal range of motion and neck supple.     Right lower leg: No edema.     Left lower leg: No edema.  Skin:    General: Skin is warm and dry.     Capillary Refill: Capillary refill takes less than 2 seconds.     Findings: No lesion or rash.  Neurological:     General: No focal deficit present.     Mental Status: She is alert and oriented to person, place, and time.     Cranial Nerves: No cranial nerve deficit.     Motor: No weakness.     Gait: Gait normal.     Deep Tendon Reflexes: Reflexes normal.  Psychiatric:        Mood and Affect: Mood normal.        Behavior: Behavior normal.     Assessment & Plan:  Margarita was seen today for annual exam.  Diagnoses and all orders for this visit:  Routine general medical examination at a health care facility Labs pending as below. She did have some coffee with sugar and cream this morning, but would prefer have lab work done while she is here.  -     Basic metabolic panel -     CBC with Differential -     Lipid panel -     TSH  Anxiety Patient declined antidepressant at this time. She has not had a prescription for Xanax in 2 years. Will try buspar today rather than a controlled medication. Follow up in 3 months, sooner if symptoms worsen or do not improve.  -     busPIRone (BUSPAR) 5 MG tablet; Take 1 tablet (5 mg total) by mouth 3 (three) times daily.  Colon cancer screening Given  mother's history of cancerous polyps at 85, will refer for colonoscopy.  -     Ambulatory referral to Gastroenterology  Class 3 severe obesity due to excess calories without serious comorbidity in adult, unspecified BMI ([place]) Discussed DASH diet, exercise today. Will obtain lab work today. Follow up with PCP regarding weight loss medication.  Screening for lipoid disorders - Labs as above  Encounter for antibody response examination For school. Paperwork completed for school.  -     Varicella zoster antibody, IgG      Outpatient Encounter Medications as of 03/10/2020  Medication Sig  . ALPRAZolam (XANAX) 0.5 MG tablet TAKE ONE TABLET BY MOUTH TWICE DAILY  . cetirizine (ZYRTEC) 10 MG tablet Take 10 mg by mouth daily.  . montelukast (SINGULAIR) 10 MG tablet Take 1 tablet (10 mg total) by mouth at bedtime. (Patient taking differently: Take 10 mg by mouth at bedtime. As needed)  . paragard intrauterine copper IUD IUD by Intrauterine route.  . busPIRone (BUSPAR) 5 MG tablet Take 1 tablet (5 mg total) by mouth 3 (three) times daily.  . [DISCONTINUED] azelastine (ASTELIN) 0.1 % nasal spray Place 1 spray into both nostrils 2 (two) times daily. Use in each nostril as directed  . [DISCONTINUED] azithromycin (ZITHROMAX Z-PAK) 250 MG tablet As directed  . [DISCONTINUED] benzonatate (TESSALON) 200 MG capsule Take 1 capsule (200 mg total) by mouth 2 (two) times daily as needed for cough.  . [DISCONTINUED] fluticasone (FLONASE) 50  MCG/ACT nasal spray Place 2 sprays into both nostrils daily.   No facility-administered encounter medications on file as of 03/10/2020.    Follow-up: Return in about 3 months (around 06/09/2020), or if symptoms worsen or fail to improve, for anxiety, weight loss.   The above assessment and management plan was discussed with the patient. The patient verbalized understanding of and has agreed to the management plan. Patient is aware to call the clinic if they develop any new  symptoms or if symptoms fail to improve or worsen. Patient is aware when to return to the clinic for a follow-up visit. Patient educated on when it is appropriate to go to the emergency department.   Gwenlyn Perking, FNP

## 2020-03-10 NOTE — Patient Instructions (Addendum)
Health Maintenance, Female Adopting a healthy lifestyle and getting preventive care are important in promoting health and wellness. Ask your health care provider about:  The right schedule for you to have regular tests and exams.  Things you can do on your own to prevent diseases and keep yourself healthy. What should I know about diet, weight, and exercise? Eat a healthy diet   Eat a diet that includes plenty of vegetables, fruits, low-fat dairy products, and lean protein.  Do not eat a lot of foods that are high in solid fats, added sugars, or sodium. Maintain a healthy weight Body mass index (BMI) is used to identify weight problems. It estimates body fat based on height and weight. Your health care provider can help determine your BMI and help you achieve or maintain a healthy weight. Get regular exercise Get regular exercise. This is one of the most important things you can do for your health. Most adults should:  Exercise for at least 150 minutes each week. The exercise should increase your heart rate and make you sweat (moderate-intensity exercise).  Do strengthening exercises at least twice a week. This is in addition to the moderate-intensity exercise.  Spend less time sitting. Even light physical activity can be beneficial. Watch cholesterol and blood lipids Have your blood tested for lipids and cholesterol at 40 years of age, then have this test every 5 years. Have your cholesterol levels checked more often if:  Your lipid or cholesterol levels are high.  You are older than 40 years of age.  You are at high risk for heart disease. What should I know about cancer screening? Depending on your health history and family history, you may need to have cancer screening at various ages. This may include screening for:  Breast cancer.  Cervical cancer.  Colorectal cancer.  Skin cancer.  Lung cancer. What should I know about heart disease, diabetes, and high blood  pressure? Blood pressure and heart disease  High blood pressure causes heart disease and increases the risk of stroke. This is more likely to develop in people who have high blood pressure readings, are of African descent, or are overweight.  Have your blood pressure checked: ? Every 3-5 years if you are 54-9 years of age. ? Every year if you are 69 years old or older. Diabetes Have regular diabetes screenings. This checks your fasting blood sugar level. Have the screening done:  Once every three years after age 36 if you are at a normal weight and have a low risk for diabetes.  More often and at a younger age if you are overweight or have a high risk for diabetes. What should I know about preventing infection? Hepatitis B If you have a higher risk for hepatitis B, you should be screened for this virus. Talk with your health care provider to find out if you are at risk for hepatitis B infection. Hepatitis C Testing is recommended for:  Everyone born from [birth year] through 1966.  Anyone with known risk factors for hepatitis C. Sexually transmitted infections (STIs)  Get screened for STIs, including gonorrhea and chlamydia, if: ? You are sexually active and are younger than 40 years of age. ? You are older than 40 years of age and your health care provider tells you that you are at risk for this type of infection. ? Your sexual activity has changed since you were last screened, and you are at increased risk for chlamydia or gonorrhea. Ask your health care provider  if you are at risk.  Ask your health care provider about whether you are at high risk for HIV. Your health care provider may recommend a prescription medicine to help prevent HIV infection. If you choose to take medicine to prevent HIV, you should first get tested for HIV. You should then be tested every 3 months for as long as you are taking the medicine. Pregnancy  If you are about to stop having your period (premenopausal) and  you may become pregnant, seek counseling before you get pregnant.  Take 400 to 800 micrograms (mcg) of folic acid every day if you become pregnant.  Ask for birth control (contraception) if you want to prevent pregnancy. Osteoporosis and menopause Osteoporosis is a disease in which the bones lose minerals and strength with aging. This can result in bone fractures. If you are 54 years old or older, or if you are at risk for osteoporosis and fractures, ask your health care provider if you should:  Be screened for bone loss.  Take a calcium or vitamin D supplement to lower your risk of fractures.  Be given hormone replacement therapy (HRT) to treat symptoms of menopause. Follow these instructions at home: Lifestyle  Do not use any products that contain nicotine or tobacco, such as cigarettes, e-cigarettes, and chewing tobacco. If you need help quitting, ask your health care provider.  Do not use street drugs.  Do not share needles.  Ask your health care provider for help if you need support or information about quitting drugs. Alcohol use  Do not drink alcohol if: ? Your health care provider tells you not to drink. ? You are pregnant, may be pregnant, or are planning to become pregnant.  If you drink alcohol: ? Limit how much you use to 0-1 drink a day. ? Limit intake if you are breastfeeding.  Be aware of how much alcohol is in your drink. In the [place] one drink equals one 12 oz bottle of beer (355 mL), one 5 oz glass of wine (148 mL), or one 1 oz glass of hard liquor (44 mL). General instructions  Schedule regular health, dental, and eye exams.  Stay current with your vaccines.  Tell your health care provider if: ? You often feel depressed. ? You have ever been abused or do not feel safe at home. Summary  Adopting a healthy lifestyle and getting preventive care are important in promoting health and wellness.  Follow your health care provider's instructions about healthy  diet, exercising, and getting tested or screened for diseases.  Follow your health care provider's instructions on monitoring your cholesterol and blood pressure. This information is not intended to replace advice given to you by your health care provider. Make sure you discuss any questions you have with your health care provider. Document Revised: 06/07/2018 Document Reviewed: 06/07/2018 Elsevier Patient Education  [place].   Colonoscopy, Adult A colonoscopy is a procedure to look at the entire large intestine. This procedure is done using a long, thin, flexible tube that has a camera on the end. You may have a colonoscopy:  As a part of normal colorectal screening.  If you have certain symptoms, such as: ? A low number of red blood cells in your blood (anemia). ? Diarrhea that does not go away. ? Pain in your abdomen. ? Blood in your stool. A colonoscopy can help screen for and diagnose medical problems, including:  Tumors.  Extra tissue that grows where mucus forms (polyps).  Inflammation.  Areas of bleeding. Tell your health care provider about:  Any allergies you have.  All medicines you are taking, including vitamins, herbs, eye drops, creams, and over-the-counter medicines.  Any problems you or family members have had with anesthetic medicines.  Any blood disorders you have.  Any surgeries you have had.  Any medical conditions you have.  Any problems you have had with having bowel movements.  Whether you are pregnant or may be pregnant. What are the risks? Generally, this is a safe procedure. However, problems may occur, including:  Bleeding.  Damage to your intestine.  Allergic reactions to medicines given during the procedure.  Infection. This is rare. What happens before the procedure? Eating and drinking restrictions Follow instructions from your health care provider about eating or drinking restrictions, which may include:  A few days  before the procedure: ? Follow a low-fiber diet. ? Avoid nuts, seeds, dried fruit, raw fruits, and vegetables.  1-3 days before the procedure: ? Eat only gelatin dessert or ice pops. ? Drink only clear liquids, such as water, clear juice, clear broth or bouillon, black coffee or tea, or clear soft drinks or sports drinks. ? Avoid liquids that contain red or purple dye.  The day of the procedure: ? Do not eat solid foods. You may continue to drink clear liquids until up to 2 hours before the procedure. ? Do not eat or drink anything starting 2 hours before the procedure, or within the time period that your health care provider recommends. Bowel prep If you were prescribed a bowel prep to take by mouth (orally) to clean out your colon:  Take it as told by your health care provider. Starting the day before your procedure, you will need to drink a large amount of liquid medicine. The liquid will cause you to have many bowel movements of loose stool until your stool becomes almost clear or light green.  If your skin or the opening between the buttocks (anus) gets irritated from diarrhea, you may relieve the irritation using: ? Wipes with medicine in them, such as adult wet wipes with aloe and vitamin E. ? A product to soothe skin, such as petroleum jelly.  If you vomit while drinking the bowel prep: ? Take a break for up to 60 minutes. ? Begin the bowel prep again. ? Call your health care provider if you keep vomiting or you cannot take the bowel prep without vomiting.  To clean out your colon, you may also be given: ? Laxative medicines. These help you have a bowel movement. ? Instructions for enema use. An enema is liquid medicine injected into your rectum. Medicines Ask your health care provider about:  Changing or stopping your regular medicines or supplements. This is especially important if you are taking iron supplements, diabetes medicines, or blood thinners.  Taking medicines  such as aspirin and ibuprofen. These medicines can thin your blood. Do not take these medicines unless your health care provider tells you to take them.  Taking over-the-counter medicines, vitamins, herbs, and supplements. General instructions  Ask your health care provider what steps will be taken to help prevent infection. These may include washing skin with a germ-killing soap.  Plan to have someone take you home from the hospital or clinic. What happens during the procedure?   An IV will be inserted into one of your veins.  You may be given one or more of the following: ? A medicine to help you relax (sedative). ? A medicine  to numb the area (local anesthetic). ? A medicine to make you fall asleep (general anesthetic). This is rarely needed.  You will lie on your side with your knees bent.  The tube will: ? Have oil or gel put on it (be lubricated). ? Be inserted into your anus. ? Be gently eased through all parts of your large intestine.  Air will be sent into your colon to keep it open. This may cause some pressure or cramping.  Images will be taken with the camera and will appear on a screen.  A small tissue sample may be removed to be looked at under a microscope (biopsy). The tissue may be sent to a lab for testing if any signs of problems are found.  If small polyps are found, they may be removed and checked for cancer cells.  When the procedure is finished, the tube will be removed. The procedure may vary among health care providers and hospitals. What happens after the procedure?  Your blood pressure, heart rate, breathing rate, and blood oxygen level will be monitored until you leave the hospital or clinic.  You may have a small amount of blood in your stool.  You may pass gas and have mild cramping or bloating in your abdomen. This is caused by the air that was used to open your colon during the exam.  Do not drive for 24 hours after the procedure.  It is up  to you to get the results of your procedure. Ask your health care provider, or the department that is doing the procedure, when your results will be ready. Summary  A colonoscopy is a procedure to look at the entire large intestine.  Follow instructions from your health care provider about eating and drinking before the procedure.  If you were prescribed an oral bowel prep to clean out your colon, take it as told by your health care provider.  During the colonoscopy, a flexible tube with a camera on its end is inserted into the anus and then passed into the other parts of the large intestine. This information is not intended to replace advice given to you by your health care provider. Make sure you discuss any questions you have with your health care provider. Document Revised: 01/05/2019 Document Reviewed: 01/05/2019 Elsevier Patient Education  [place] DASH stands for "Dietary Approaches to Stop Hypertension." The DASH eating plan is a healthy eating plan that has been shown to reduce high blood pressure (hypertension). It may also reduce your risk for type 2 diabetes, heart disease, and stroke. The DASH eating plan may also help with weight loss. What are tips for following this plan?  General guidelines  Avoid eating more than 2,300 mg (milligrams) of salt (sodium) a day. If you have hypertension, you may need to reduce your sodium intake to 1,500 mg a day.  Limit alcohol intake to no more than 1 drink a day for nonpregnant women and 2 drinks a day for men. One drink equals 12 oz of beer, 5 oz of wine, or 1 oz of hard liquor.  Work with your health care provider to maintain a healthy body weight or to lose weight. Ask what an ideal weight is for you.  Get at least 30 minutes of exercise that causes your heart to beat faster (aerobic exercise) most days of the week. Activities may include walking, swimming, or biking.  Work with your health care provider  or diet and nutrition specialist (  dietitian) to adjust your eating plan to your individual calorie needs. Reading food labels   Check food labels for the amount of sodium per serving. Choose foods with less than 5 percent of the Daily Value of sodium. Generally, foods with less than 300 mg of sodium per serving fit into this eating plan.  To find whole grains, look for the word "whole" as the first word in the ingredient list. Shopping  Buy products labeled as "low-sodium" or "no salt added."  Buy fresh foods. Avoid canned foods and premade or frozen meals. Cooking  Avoid adding salt when cooking. Use salt-free seasonings or herbs instead of table salt or sea salt. Check with your health care provider or pharmacist before using salt substitutes.  Do not fry foods. Cook foods using healthy methods such as baking, boiling, grilling, and broiling instead.  Cook with heart-healthy oils, such as olive, canola, soybean, or sunflower oil. Meal planning  Eat a balanced diet that includes: ? 5 or more servings of fruits and vegetables each day. At each meal, try to fill half of your plate with fruits and vegetables. ? Up to 6-8 servings of whole grains each day. ? Less than 6 oz of lean meat, poultry, or fish each day. A 3-oz serving of meat is about the same size as a deck of cards. One egg equals 1 oz. ? 2 servings of low-fat dairy each day. ? A serving of nuts, seeds, or beans 5 times each week. ? Heart-healthy fats. Healthy fats called Omega-3 fatty acids are found in foods such as flaxseeds and coldwater fish, like sardines, salmon, and mackerel.  Limit how much you eat of the following: ? Canned or prepackaged foods. ? Food that is high in trans fat, such as fried foods. ? Food that is high in saturated fat, such as fatty meat. ? Sweets, desserts, sugary drinks, and other foods with added sugar. ? Full-fat dairy products.  Do not salt foods before eating.  Try to eat at least 2  vegetarian meals each week.  Eat more home-cooked food and less restaurant, buffet, and fast food.  When eating at a restaurant, ask that your food be prepared with less salt or no salt, if possible. What foods are recommended? The items listed may not be a complete list. Talk with your dietitian about what dietary choices are best for you. Grains Whole-grain or whole-wheat bread. Whole-grain or whole-wheat pasta. Brown rice. [place]. Bulgur. Whole-grain and low-sodium cereals. Pita bread. Low-fat, low-sodium crackers. Whole-wheat flour tortillas. Vegetables Fresh or frozen vegetables (raw, steamed, roasted, or grilled). Low-sodium or reduced-sodium tomato and vegetable juice. Low-sodium or reduced-sodium tomato sauce and tomato paste. Low-sodium or reduced-sodium canned vegetables. Fruits All fresh, dried, or frozen fruit. Canned fruit in natural juice (without added sugar). Meat and other protein foods Skinless chicken or [place]. Ground chicken or [place]. Pork with fat trimmed off. Fish and seafood. Egg whites. Dried beans, peas, or lentils. Unsalted nuts, nut butters, and seeds. Unsalted canned beans. Lean cuts of beef with fat trimmed off. Low-sodium, lean deli meat. Dairy Low-fat (1%) or fat-free (skim) milk. Fat-free, low-fat, or reduced-fat cheeses. Nonfat, low-sodium ricotta or cottage cheese. Low-fat or nonfat yogurt. Low-fat, low-sodium cheese. Fats and oils Soft margarine without trans fats. Vegetable oil. Low-fat, reduced-fat, or light mayonnaise and salad dressings (reduced-sodium). Canola, safflower, olive, soybean, and sunflower oils. Avocado. Seasoning and other foods Herbs. Spices. Seasoning mixes without salt. Unsalted popcorn and pretzels. Fat-free sweets. What foods are not  recommended? The items listed may not be a complete list. Talk with your dietitian about what dietary choices are best for you. Grains Baked goods made with fat, such as croissants, muffins, or  some breads. Dry pasta or rice meal packs. Vegetables Creamed or fried vegetables. Vegetables in a cheese sauce. Regular canned vegetables (not low-sodium or reduced-sodium). Regular canned tomato sauce and paste (not low-sodium or reduced-sodium). Regular tomato and vegetable juice (not low-sodium or reduced-sodium). Angie Fava. Olives. Fruits Canned fruit in a light or heavy syrup. Fried fruit. Fruit in cream or butter sauce. Meat and other protein foods Fatty cuts of meat. Ribs. Fried meat. Berniece Salines. Sausage. Bologna and other processed lunch meats. Salami. Fatback. Hotdogs. Bratwurst. Salted nuts and seeds. Canned beans with added salt. Canned or smoked fish. Whole eggs or egg yolks. Chicken or [place] with skin. Dairy Whole or 2% milk, cream, and half-and-half. Whole or full-fat cream cheese. Whole-fat or sweetened yogurt. Full-fat cheese. Nondairy creamers. Whipped toppings. Processed cheese and cheese spreads. Fats and oils Butter. Stick margarine. Lard. Shortening. Ghee. Bacon fat. Tropical oils, such as coconut, palm kernel, or palm oil. Seasoning and other foods Salted popcorn and pretzels. Onion salt, garlic salt, seasoned salt, table salt, and sea salt. Worcestershire sauce. Tartar sauce. Barbecue sauce. Teriyaki sauce. Soy sauce, including reduced-sodium. Steak sauce. Canned and packaged gravies. Fish sauce. Oyster sauce. Cocktail sauce. Horseradish that you find on the shelf. Ketchup. Mustard. Meat flavorings and tenderizers. Bouillon cubes. Hot sauce and Tabasco sauce. Premade or packaged marinades. Premade or packaged taco seasonings. Relishes. Regular salad dressings. Where to find more information:  National Heart, Lung, and [place]: https://wilson-eaton.com/  American Heart Association: www.heart.org Summary  The DASH eating plan is a healthy eating plan that has been shown to reduce high blood pressure (hypertension). It may also reduce your risk for type 2 diabetes, heart disease,  and stroke.  With the DASH eating plan, you should limit salt (sodium) intake to 2,300 mg a day. If you have hypertension, you may need to reduce your sodium intake to 1,500 mg a day.  When on the DASH eating plan, aim to eat more fresh fruits and vegetables, whole grains, lean proteins, low-fat dairy, and heart-healthy fats.  Work with your health care provider or diet and nutrition specialist (dietitian) to adjust your eating plan to your individual calorie needs. This information is not intended to replace advice given to you by your health care provider. Make sure you discuss any questions you have with your health care provider. Document Revised: 05/27/2017 Document Reviewed: 06/07/2016 Elsevier Patient Education  2020 [employer].

## 2020-03-11 LAB — BASIC METABOLIC PANEL
BUN/Creatinine Ratio: 12 (ref 9–23)
BUN: 12 mg/dL (ref 6–24)
CO2: 21 mmol/L (ref 20–29)
Calcium: 9.2 mg/dL (ref 8.7–10.2)
Chloride: 105 mmol/L (ref 96–106)
Creatinine, Ser: 1 mg/dL (ref 0.57–1.00)
GFR calc Af Amer: 81 mL/min/{1.73_m2} (ref >59)
GFR calc non Af Amer: 71 mL/min/{1.73_m2} (ref >59)
Glucose: 84 mg/dL (ref 65–99)
Potassium: 4.3 mmol/L (ref 3.5–5.2)
Sodium: 139 mmol/L (ref 134–144)

## 2020-03-11 LAB — CBC WITH DIFFERENTIAL/PLATELET
Basophils Absolute: 0.1 10*3/uL (ref 0.0–0.2)
Basos: 1 %
EOS (ABSOLUTE): 1.6 10*3/uL — ABNORMAL HIGH (ref 0.0–0.4)
Eos: 24 %
Hematocrit: 37.2 % (ref 34.0–46.6)
Hemoglobin: 11.6 g/dL (ref 11.1–15.9)
Immature Grans (Abs): 0 10*3/uL (ref 0.0–0.1)
Immature Granulocytes: 0 %
Lymphocytes Absolute: 1.8 10*3/uL (ref 0.7–3.1)
Lymphs: 27 %
MCH: 26.5 pg — ABNORMAL LOW (ref 26.6–33.0)
MCHC: 31.2 g/dL — ABNORMAL LOW (ref 31.5–35.7)
MCV: 85 fL (ref 79–97)
Monocytes Absolute: 0.5 10*3/uL (ref 0.1–0.9)
Monocytes: 7 %
Neutrophils Absolute: 2.8 10*3/uL (ref 1.4–7.0)
Neutrophils: 41 %
Platelets: 319 10*3/uL (ref 150–450)
RBC: 4.37 x10E6/uL (ref 3.77–5.28)
RDW: 12.8 % (ref 11.7–15.4)
WBC: 6.8 10*3/uL (ref 3.4–10.8)

## 2020-03-11 LAB — LIPID PANEL
Chol/HDL Ratio: 2.6 ratio (ref 0.0–4.4)
Cholesterol, Total: 181 mg/dL (ref 100–199)
HDL: 70 mg/dL (ref >39)
LDL Chol Calc (NIH): 97 mg/dL (ref 0–99)
Triglycerides: 74 mg/dL (ref 0–149)
VLDL Cholesterol Cal: 14 mg/dL (ref 5–40)

## 2020-03-11 LAB — TSH: TSH: 1.12 u[IU]/mL (ref 0.450–4.500)

## 2020-03-11 LAB — VARICELLA ZOSTER ANTIBODY, IGG: Varicella zoster IgG: 3009 index (ref >165)

## 2020-03-13 ENCOUNTER — Telehealth: Payer: Self-pay | Admitting: Nurse Practitioner

## 2020-03-13 ENCOUNTER — Other Ambulatory Visit: Payer: 59

## 2020-03-13 ENCOUNTER — Other Ambulatory Visit: Payer: Self-pay

## 2020-03-13 DIAGNOSIS — Z111 Encounter for screening for respiratory tuberculosis: Secondary | ICD-10-CM

## 2020-03-13 NOTE — Telephone Encounter (Signed)
Placed order for TB Gold labwork. Patient will stop by today and have them drawn

## 2020-03-13 NOTE — Telephone Encounter (Signed)
You seen the patient 03/10/20 - would you put in an order for a TB skin test for patient to come in the nurse schedule to have it done ?

## 2020-03-13 NOTE — Progress Notes (Signed)
tb 

## 2020-03-15 LAB — QUANTIFERON-TB GOLD PLUS
QuantiFERON Mitogen Value: >10 IU/mL
QuantiFERON Nil Value: 0.08 IU/mL
QuantiFERON TB1 Ag Value: 0.14 IU/mL
QuantiFERON TB2 Ag Value: 0.15 IU/mL
QuantiFERON-TB Gold Plus: NEGATIVE

## 2020-04-07 ENCOUNTER — Encounter: Payer: Self-pay | Admitting: Gastroenterology

## 2020-04-07 ENCOUNTER — Telehealth: Payer: Self-pay

## 2020-04-14 ENCOUNTER — Encounter: Payer: Self-pay | Admitting: Physician Assistant

## 2020-04-30 ENCOUNTER — Encounter: Payer: Self-pay | Admitting: Gastroenterology

## 2020-05-01 ENCOUNTER — Ambulatory Visit: Payer: Self-pay | Admitting: Physician Assistant

## 2020-06-09 ENCOUNTER — Ambulatory Visit: Payer: 59 | Admitting: Nurse Practitioner

## 2020-06-09 ENCOUNTER — Encounter: Payer: Self-pay | Admitting: *Deleted

## 2020-06-09 ENCOUNTER — Other Ambulatory Visit: Payer: Self-pay

## 2020-06-09 ENCOUNTER — Ambulatory Visit: Payer: 59 | Admitting: Family Medicine

## 2020-06-09 ENCOUNTER — Encounter: Payer: Self-pay | Admitting: Nurse Practitioner

## 2020-06-09 ENCOUNTER — Telehealth: Payer: Self-pay

## 2020-06-09 VITALS — BP 112/77 | HR 77 | Temp 98.4°F | Resp 20 | Ht 68.0 in | Wt 283.0 lb

## 2020-06-09 DIAGNOSIS — F419 Anxiety disorder, unspecified: Secondary | ICD-10-CM | POA: Diagnosis not present

## 2020-06-09 DIAGNOSIS — Z0289 Encounter for other administrative examinations: Secondary | ICD-10-CM | POA: Insufficient documentation

## 2020-06-09 DIAGNOSIS — E8881 Metabolic syndrome: Secondary | ICD-10-CM | POA: Insufficient documentation

## 2020-06-09 DIAGNOSIS — Z6841 Body Mass Index (BMI) 40.0 and over, adult: Secondary | ICD-10-CM

## 2020-06-09 DIAGNOSIS — J309 Allergic rhinitis, unspecified: Secondary | ICD-10-CM

## 2020-06-09 DIAGNOSIS — M5489 Other dorsalgia: Secondary | ICD-10-CM | POA: Diagnosis not present

## 2020-06-09 MED ORDER — NAPROXEN 500 MG PO TABS: 500.0000 mg | ORAL_TABLET | Freq: Two times a day (BID) | ORAL | 0 refills | Status: DC

## 2020-06-09 MED ORDER — AZELASTINE HCL 0.1 % NA SOLN: 1.0000 | Freq: Two times a day (BID) | NASAL | 0 refills | Status: DC

## 2020-06-09 MED ORDER — LIRAGLUTIDE 18 MG/3ML ~~LOC~~ SOPN: 0.6000 mg | PEN_INJECTOR | Freq: Every morning | SUBCUTANEOUS | 2 refills | Status: DC

## 2020-06-09 MED ORDER — FLUTICASONE PROPIONATE 50 MCG/ACT NA SUSP: 2.0000 | Freq: Every day | NASAL | 6 refills | Status: DC

## 2020-06-09 MED ORDER — MONTELUKAST SODIUM 10 MG PO TABS: 10.0000 mg | ORAL_TABLET | Freq: Every day | ORAL | 2 refills | Status: DC

## 2020-06-09 MED ORDER — CYCLOBENZAPRINE HCL 5 MG PO TABS: 5.0000 mg | ORAL_TABLET | Freq: Three times a day (TID) | ORAL | 1 refills | Status: DC | PRN

## 2020-06-09 MED ORDER — ALPRAZOLAM 0.5 MG PO TABS: 0.5000 mg | ORAL_TABLET | Freq: Two times a day (BID) | ORAL | 0 refills | Status: DC | PRN

## 2020-06-09 MED ORDER — ALPRAZOLAM 0.5 MG PO TABS: 0.5000 mg | ORAL_TABLET | Freq: Two times a day (BID) | ORAL | 1 refills | Status: DC | PRN

## 2020-06-09 MED ORDER — LIRAGLUTIDE 18 MG/3ML ~~LOC~~ SOPN: 0.6000 mg | PEN_INJECTOR | Freq: Every morning | SUBCUTANEOUS | 0 refills | Status: DC

## 2020-06-09 NOTE — Assessment & Plan Note (Signed)
Weight not well controlled.  Started patient on Saxenda 0.6 mg tablet by mouth daily for 1 week, increase by 0.6 mg/day and weekly intervals until dose of 3 mg/day achieved.  Will discontinue if patient cannot tolerate or will reevaluate change in body weight 16 weeks after initiating Saxenda.  Will discontinue Saxenda if the patient has not lost at least 4% of baseline body weight.  Education provided to patient with printed handouts given.  Patient verbalized understanding.

## 2020-06-09 NOTE — Assessment & Plan Note (Signed)
Back pain not well controlled started patient on naproxen 500 mg twice daily as needed, weight loss advice, Flexeril for muscle spasm, rest joints, warm or cold compress as tolerated.  Follow-up with worsening or unresolved symptoms  Rx sent to pharmacy.

## 2020-06-09 NOTE — Assessment & Plan Note (Signed)
Well-controlled on current medication Xanax 0.5 mg 1 tablet by mouth twice daily as needed.  Provided education to patient on stress management, printed handouts given.  Rx sent to pharmacy.  Follow-up with unresolved or worsening symptoms.

## 2020-06-09 NOTE — Telephone Encounter (Signed)
Received a call from [place] from the drug store.  Two questions about Victozia 1- If provider was trying to dispence 3 pens then the quantity needs to be 9 ML and also needs a different NDC 2- Pharmacy wants clarification of directions.  States he has never seen a dose as high as 3mg  per day. If this is the correct dose states he does not have a one dose that is 3mg  a day.   Please review and re send rx

## 2020-06-09 NOTE — Patient Instructions (Addendum)
For back pain started patient on naproxen 500 mg twice daily with food, Flexeril muscle relaxant, ice application, brace. Follow-up with worsening unresolved symptoms.   Calorie Counting for Weight Loss Calories are units of energy. Your body needs a certain amount of calories from food to keep you going throughout the day. When you eat more calories than your body needs, your body stores the extra calories as fat. When you eat fewer calories than your body needs, your body burns fat to get the energy it needs. Calorie counting means keeping track of how many calories you eat and drink each day. Calorie counting can be helpful if you need to lose weight. If you make sure to eat fewer calories than your body needs, you should lose weight. Ask your health care provider what a healthy weight is for you. For calorie counting to work, you will need to eat the right number of calories in a day in order to lose a healthy amount of weight per week. A dietitian can help you determine how many calories you need in a day and will give you suggestions on how to reach your calorie goal.  A healthy amount of weight to lose per week is usually 1-2 lb (0.5-0.9 kg). This usually means that your daily calorie intake should be reduced by 500-750 calories.  Eating 1,200 - 1,500 calories per day can help most women lose weight.  Eating 1,500 - 1,800 calories per day can help most men lose weight. What is my plan? My goal is to have __________ calories per day. If I have this many calories per day, I should lose around __________ pounds per week. What do I need to know about calorie counting? In order to meet your daily calorie goal, you will need to:  Find out how many calories are in each food you would like to eat. Try to do this before you eat.  Decide how much of the food you plan to eat.  Write down what you ate and how many calories it had. Doing this is called keeping a food log. To successfully lose  weight, it is important to balance calorie counting with a healthy lifestyle that includes regular activity. Aim for 150 minutes of moderate exercise (such as walking) or 75 minutes of vigorous exercise (such as running) each week. Where do I find calorie information?  The number of calories in a food can be found on a Nutrition Facts label. If a food does not have a Nutrition Facts label, try to look up the calories online or ask your dietitian for help. Remember that calories are listed per serving. If you choose to have more than one serving of a food, you will have to multiply the calories per serving by the amount of servings you plan to eat. For example, the label on a package of bread might say that a serving size is 1 slice and that there are 90 calories in a serving. If you eat 1 slice, you will have eaten 90 calories. If you eat 2 slices, you will have eaten 180 calories. How do I keep a food log? Immediately after each meal, record the following information in your food log:  What you ate. Don't forget to include toppings, sauces, and other extras on the food.  How much you ate. This can be measured in cups, ounces, or number of items.  How many calories each food and drink had.  The total number of calories in the meal.  Keep your food log near you, such as in a small notebook in your pocket, or use a mobile app or website. Some programs will calculate calories for you and show you how many calories you have left for the day to meet your goal. What are some calorie counting tips?   Use your calories on foods and drinks that will fill you up and not leave you hungry: ? Some examples of foods that fill you up are nuts and nut butters, vegetables, lean proteins, and high-fiber foods like whole grains. High-fiber foods are foods with more than 5 g fiber per serving. ? Drinks such as sodas, specialty coffee drinks, alcohol, and juices have a lot of calories, yet do not fill you up.  Eat  nutritious foods and avoid empty calories. Empty calories are calories you get from foods or beverages that do not have many vitamins or protein, such as candy, sweets, and soda. It is better to have a nutritious high-calorie food (such as an avocado) than a food with few nutrients (such as a bag of chips).  Know how many calories are in the foods you eat most often. This will help you calculate calorie counts faster.  Pay attention to calories in drinks. Low-calorie drinks include water and unsweetened drinks.  Pay attention to nutrition labels for "low fat" or "fat free" foods. These foods sometimes have the same amount of calories or more calories than the full fat versions. They also often have added sugar, starch, or salt, to make up for flavor that was removed with the fat.  Find a way of tracking calories that works for you. Get creative. Try different apps or programs if writing down calories does not work for you. What are some portion control tips?  Know how many calories are in a serving. This will help you know how many servings of a certain food you can have.  Use a measuring cup to measure serving sizes. You could also try weighing out portions on a kitchen scale. With time, you will be able to estimate serving sizes for some foods.  Take some time to put servings of different foods on your favorite plates, bowls, and cups so you know what a serving looks like.  Try not to eat straight from a bag or box. Doing this can lead to overeating. Put the amount you would like to eat in a cup or on a plate to make sure you are eating the right portion.  Use smaller plates, glasses, and bowls to prevent overeating.  Try not to multitask (for example, watch TV or use your computer) while eating. If it is time to eat, sit down at a table and enjoy your food. This will help you to know when you are full. It will also help you to be aware of what you are eating and how much you are eating. What  are tips for following this plan? Reading food labels  Check the calorie count compared to the serving size. The serving size may be smaller than what you are used to eating.  Check the source of the calories. Make sure the food you are eating is high in vitamins and protein and low in saturated and trans fats. Shopping  Read nutrition labels while you shop. This will help you make healthy decisions before you decide to purchase your food.  Make a grocery list and stick to it. Cooking  Try to cook your favorite foods in a healthier way. For  example, try baking instead of frying.  Use low-fat dairy products. Meal planning  Use more fruits and vegetables. Half of your plate should be fruits and vegetables.  Include lean proteins like poultry and fish. How do I count calories when eating out?  Ask for smaller portion sizes.  Consider sharing an entree and sides instead of getting your own entree.  If you get your own entree, eat only half. Ask for a box at the beginning of your meal and put the rest of your entree in it so you are not tempted to eat it.  If calories are listed on the menu, choose the lower calorie options.  Choose dishes that include vegetables, fruits, whole grains, low-fat dairy products, and lean protein.  Choose items that are boiled, broiled, grilled, or steamed. Stay away from items that are buttered, battered, fried, or served with cream sauce. Items labeled "crispy" are usually fried, unless stated otherwise.  Choose water, low-fat milk, unsweetened iced tea, or other drinks without added sugar. If you want an alcoholic beverage, choose a lower calorie option such as a glass of wine or light beer.  Ask for dressings, sauces, and syrups on the side. These are usually high in calories, so you should limit the amount you eat.  If you want a salad, choose a garden salad and ask for grilled meats. Avoid extra toppings like bacon, cheese, or fried items. Ask for  the dressing on the side, or ask for olive oil and vinegar or lemon to use as dressing.  Estimate how many servings of a food you are given. For example, a serving of cooked rice is  cup or about the size of half a baseball. Knowing serving sizes will help you be aware of how much food you are eating at restaurants. The list below tells you how big or small some common portion sizes are based on everyday objects: ? 1 oz--4 stacked dice. ? 3 oz--1 deck of cards. ? 1 tsp--1 die. ? 1 Tbsp-- a ping-pong ball. ? 2 Tbsp--1 ping-pong ball. ?  cup-- baseball. ? 1 cup--1 baseball. Summary  Calorie counting means keeping track of how many calories you eat and drink each day. If you eat fewer calories than your body needs, you should lose weight.  A healthy amount of weight to lose per week is usually 1-2 lb (0.5-0.9 kg). This usually means reducing your daily calorie intake by 500-750 calories.  The number of calories in a food can be found on a Nutrition Facts label. If a food does not have a Nutrition Facts label, try to look up the calories online or ask your dietitian for help.  Use your calories on foods and drinks that will fill you up, and not on foods and drinks that will leave you hungry.  Use smaller plates, glasses, and bowls to prevent overeating. This information is not intended to replace advice given to you by your health care provider. Make sure you discuss any questions you have with your health care provider. Document Revised: 03/03/2018 Document Reviewed: 05/14/2016 Elsevier Patient Education  [place].  Acute Back Pain, Adult Acute back pain is sudden and usually short-lived. It is often caused by an injury to the muscles and tissues in the back. The injury may result from:  A muscle or ligament getting overstretched or torn (strained). Ligaments are tissues that connect bones to each other. Lifting something improperly can cause a back strain.  Wear and tear  (degeneration)  of the spinal disks. Spinal disks are circular tissue that provides cushioning between the bones of the spine (vertebrae).  Twisting motions, such as while playing sports or doing yard work.  A hit to the back.  Arthritis. You may have a physical exam, lab tests, and imaging tests to find the cause of your pain. Acute back pain usually goes away with rest and home care. Follow these instructions at home: Managing pain, stiffness, and swelling  Take over-the-counter and prescription medicines only as told by your health care provider.  Your health care provider may recommend applying ice during the first 24-48 hours after your pain starts. To do this: ? Put ice in a plastic bag. ? Place a towel between your skin and the bag. ? Leave the ice on for 20 minutes, 2-3 times a day.  If directed, apply heat to the affected area as often as told by your health care provider. Use the heat source that your health care provider recommends, such as a moist heat pack or a heating pad. ? Place a towel between your skin and the heat source. ? Leave the heat on for 20-30 minutes. ? Remove the heat if your skin turns bright red. This is especially important if you are unable to feel pain, heat, or cold. You have a greater risk of getting burned. Activity   Do not stay in bed. Staying in bed for more than 1-2 days can delay your recovery.  Sit up and stand up straight. Avoid leaning forward when you sit, or hunching over when you stand. ? If you work at a desk, sit close to it so you do not need to lean over. Keep your chin tucked in. Keep your neck drawn back, and keep your elbows bent at a right angle. Your arms should look like the letter "L." ? Sit high and close to the steering wheel when you drive. Add lower back (lumbar) support to your car seat, if needed.  Take short walks on even surfaces as soon as you are able. Try to increase the length of time you walk each day.  Do not  sit, drive, or stand in one place for more than 30 minutes at a time. Sitting or standing for long periods of time can put stress on your back.  Do not drive or use heavy machinery while taking prescription pain medicine.  Use proper lifting techniques. When you bend and lift, use positions that put less stress on your back: ? Center Line your knees. ? Keep the load close to your body. ? Avoid twisting.  Exercise regularly as told by your health care provider. Exercising helps your back heal faster and helps prevent back injuries by keeping muscles strong and flexible.  Work with a physical therapist to make a safe exercise program, as recommended by your health care provider. Do any exercises as told by your physical therapist. Lifestyle  Maintain a healthy weight. Extra weight puts stress on your back and makes it difficult to have good posture.  Avoid activities or situations that make you feel anxious or stressed. Stress and anxiety increase muscle tension and can make back pain worse. Learn ways to manage anxiety and stress, such as through exercise. General instructions  Sleep on a firm mattress in a comfortable position. Try lying on your side with your knees slightly bent. If you lie on your back, put a pillow under your knees.  Follow your treatment plan as told by your health care provider.  This may include: ? Cognitive or behavioral therapy. ? Acupuncture or massage therapy. ? Meditation or yoga. Contact a health care provider if:  You have pain that is not relieved with rest or medicine.  You have increasing pain going down into your legs or buttocks.  Your pain does not improve after 2 weeks.  You have pain at night.  You lose weight without trying.  You have a fever or chills. Get help right away if:  You develop new bowel or bladder control problems.  You have unusual weakness or numbness in your arms or legs.  You develop nausea or vomiting.  You develop  abdominal pain.  You feel faint. Summary  Acute back pain is sudden and usually short-lived.  Use proper lifting techniques. When you bend and lift, use positions that put less stress on your back.  Take over-the-counter and prescription medicines and apply heat or ice as directed by your health care provider. This information is not intended to replace advice given to you by your health care provider. Make sure you discuss any questions you have with your health care provider. Document Revised: 10/03/2018 Document Reviewed: 01/26/2017 Elsevier Patient Education  [place]. Generalized Anxiety Disorder, Adult Generalized anxiety disorder (GAD) is a mental health disorder. People with this condition constantly worry about everyday events. Unlike normal anxiety, worry related to GAD is not triggered by a specific event. These worries also do not fade or get better with time. GAD interferes with life functions, including relationships, work, and school. GAD can vary from mild to severe. People with severe GAD can have intense waves of anxiety with physical symptoms (panic attacks). What are the causes? The exact cause of GAD is not known. What increases the risk? This condition is more likely to develop in:  Women.  People who have a family history of anxiety disorders.  People who are very shy.  People who experience very stressful life events, such as the death of a loved one.  People who have a very stressful family environment. What are the signs or symptoms? People with GAD often worry excessively about many things in their lives, such as their health and family. They may also be overly concerned about:  Doing well at work.  Being on time.  Natural disasters.  Friendships. Physical symptoms of GAD include:  Fatigue.  Muscle tension or having muscle twitches.  Trembling or feeling shaky.  Being easily startled.  Feeling like your heart is pounding or  racing.  Feeling out of breath or like you cannot take a deep breath.  Having trouble falling asleep or staying asleep.  Sweating.  Nausea, diarrhea, or irritable bowel syndrome (IBS).  Headaches.  Trouble concentrating or remembering facts.  Restlessness.  Irritability. How is this diagnosed? Your health care provider can diagnose GAD based on your symptoms and medical history. You will also have a physical exam. The health care provider will ask specific questions about your symptoms, including how severe they are, when they started, and if they come and go. Your health care provider may ask you about your use of alcohol or drugs, including prescription medicines. Your health care provider may refer you to a mental health specialist for further evaluation. Your health care provider will do a thorough examination and may perform additional tests to rule out other possible causes of your symptoms. To be diagnosed with GAD, a person must have anxiety that:  Is out of his or her control.  Affects several  different aspects of his or her life, such as work and relationships.  Causes distress that makes him or her unable to take part in normal activities.  Includes at least three physical symptoms of GAD, such as restlessness, fatigue, trouble concentrating, irritability, muscle tension, or sleep problems. Before your health care provider can confirm a diagnosis of GAD, these symptoms must be present more days than they are not, and they must last for six months or longer. How is this treated? The following therapies are usually used to treat GAD:  Medicine. Antidepressant medicine is usually prescribed for long-term daily control. Antianxiety medicines may be added in severe cases, especially when panic attacks occur.  Talk therapy (psychotherapy). Certain types of talk therapy can be helpful in treating GAD by providing support, education, and guidance. Options include: ? Cognitive  behavioral therapy (CBT). People learn coping skills and techniques to ease their anxiety. They learn to identify unrealistic or negative thoughts and behaviors and to replace them with positive ones. ? Acceptance and commitment therapy (ACT). This treatment teaches people how to be mindful as a way to cope with unwanted thoughts and feelings. ? Biofeedback. This process trains you to manage your body's response (physiological response) through breathing techniques and relaxation methods. You will work with a therapist while machines are used to monitor your physical symptoms.  Stress management techniques. These include yoga, meditation, and exercise. A mental health specialist can help determine which treatment is best for you. Some people see improvement with one type of therapy. However, other people require a combination of therapies. Follow these instructions at home:  Take over-the-counter and prescription medicines only as told by your health care provider.  Try to maintain a normal routine.  Try to anticipate stressful situations and allow extra time to manage them.  Practice any stress management or self-calming techniques as taught by your health care provider.  Do not punish yourself for setbacks or for not making progress.  Try to recognize your accomplishments, even if they are small.  Keep all follow-up visits as told by your health care provider. This is important. Contact a health care provider if:  Your symptoms do not get better.  Your symptoms get worse.  You have signs of depression, such as: ? A persistently sad, cranky, or irritable mood. ? Loss of enjoyment in activities that used to bring you joy. ? Change in weight or eating. ? Changes in sleeping habits. ? Avoiding friends or family members. ? Loss of energy for normal tasks. ? Feelings of guilt or worthlessness. Get help right away if:  You have serious thoughts about hurting yourself or others. If  you ever feel like you may hurt yourself or others, or have thoughts about taking your own life, get help right away. You can go to your nearest emergency department or call:  Your local emergency services (911 in the [place]  A suicide crisis helpline, such as the [facility] at (607)413-2099. This is open 24 hours a day. Summary  Generalized anxiety disorder (GAD) is a mental health disorder that involves worry that is not triggered by a specific event.  People with GAD often worry excessively about many things in their lives, such as their health and family.  GAD may cause physical symptoms such as restlessness, trouble concentrating, sleep problems, frequent sweating, nausea, diarrhea, headaches, and trembling or muscle twitching.  A mental health specialist can help determine which treatment is best for you. Some people see improvement with  one type of therapy. However, other people require a combination of therapies. This information is not intended to replace advice given to you by your health care provider. Make sure you discuss any questions you have with your health care provider. Document Revised: 05/27/2017 Document Reviewed: 05/04/2016 Elsevier Patient Education  2020 [employer].

## 2020-06-09 NOTE — Progress Notes (Signed)
Established Patient Office Visit  Subjective:  Patient ID: Gloria Carlson, female    DOB: 1980/08/21  Age: 39 y.o. MRN: [id number]  CC:  Chief Complaint  Patient presents with  . Medical Management of Chronic Issues    3 mo     HPI Gloria Carlson presents for Anxiety: Patient complains of anxiety disorder.  She has the following symptoms: difficulty concentrating. Onset of symptoms was approximately 2 years ago, unchanged since that time. She denies current suicidal and homicidal ideation. Family history significant for no psychiatric illness.Possible organic causes contributing are: none. Risk factors: This episode of anxiety Previous treatment includes Xanax.  She complains of the following side effects from the treatment: none   Back Pain This is a recurrent problem. The current episode started more than 1 month ago. The problem occurs constantly. The problem is unchanged. The pain is present in the lumbar spine. The pain is at a severity of 5/10. The pain is moderate. The symptoms are aggravated by bending, lying down and position. Stiffness is present all day. Associated symptoms include leg pain. Pertinent negatives include no headaches, numbness, paresthesias, perianal numbness, tingling or weakness. She has tried nothing for the symptoms.    Concerning patient's BMI [43.03 kg/mm] patient is reporting increased weight.  Patient is reporting a total weight of 283 pounds.  Patient reports trying to maintain a healthy diet and exercise but has been unable to exercise lately due to pain in her back and right knee.   Past Medical History:  Diagnosis Date  . Anxiety     History reviewed. No pertinent surgical history.  Family History  Problem Relation Age of Onset  . Hyperlipidemia Mother   . Hypertension Father     Social History   Socioeconomic History  . Marital status: Single    Spouse name: Not on file  . Number of children: Not on file  . Years of education: Not on file   . Highest education level: Not on file  Occupational History  . Not on file  Tobacco Use  . Smoking status: Former Research scientist (life sciences)  . Smokeless tobacco: Never Used  Vaping Use  . Vaping Use: Never used  Substance and Sexual Activity  . Alcohol use: Yes  . Drug use: No  . Sexual activity: Not on file  Other Topics Concern  . Not on file  Social History Narrative  . Not on file   Social Determinants of Health   Financial Resource Strain: Not on file  Food Insecurity: Not on file  Transportation Needs: Not on file  Physical Activity: Not on file  Stress: Not on file  Social Connections: Not on file  Intimate Partner Violence: Not on file    Outpatient Medications Prior to Visit  Medication Sig Dispense Refill  . cetirizine (ZYRTEC) 10 MG tablet Take 10 mg by mouth daily.    . paragard intrauterine copper IUD IUD by Intrauterine route.    . ALPRAZolam (XANAX) 0.5 MG tablet TAKE ONE TABLET BY MOUTH TWICE DAILY 30 tablet 0  . montelukast (SINGULAIR) 10 MG tablet Take 1 tablet (10 mg total) by mouth at bedtime. (Patient taking differently: Take 10 mg by mouth at bedtime. As needed) 30 tablet 11   No facility-administered medications prior to visit.    Allergies  Allergen Reactions  . Macrodantin [Nitrofurantoin Macrocrystal] Itching and Rash    ROS Review of Systems  Musculoskeletal: Positive for back pain.  Neurological: Negative for tingling, weakness, numbness, headaches and paresthesias.  All other systems reviewed and are negative.     Objective:    Physical Exam Vitals reviewed.  Constitutional:      Appearance: Normal appearance. She is well-groomed. She is obese.  HENT:     Head: Normocephalic.     Nose: Nose normal.  Eyes:     Conjunctiva/sclera: Conjunctivae normal.  Cardiovascular:     Rate and Rhythm: Normal rate and regular rhythm.     Pulses: Normal pulses.     Heart sounds: Normal heart sounds.  Pulmonary:     Effort: Pulmonary effort is normal.      Breath sounds: Normal breath sounds.  Abdominal:     General: Bowel sounds are normal.  Musculoskeletal:        General: Tenderness present.     Cervical back: Normal range of motion.     Right lower leg: No edema.     Left lower leg: No edema.  Skin:    General: Skin is warm.  Neurological:     Mental Status: She is alert and oriented to person, place, and time.  Psychiatric:     Comments: Anxiety     Resp 20   Ht 5\' 8"  (1.727 m)   Wt 283 lb (128.4 kg)   LMP 06/09/2020   BMI 43.03 kg/m  Wt Readings from Last 3 Encounters:  06/09/20 283 lb (128.4 kg)  03/10/20 279 lb (126.6 kg)  08/15/17 195 lb 12.8 oz (88.8 kg)       Lab Results  Component Value Date   TSH 1.120 03/10/2020   Lab Results  Component Value Date   WBC 6.8 03/10/2020   HGB 11.6 03/10/2020   HCT 37.2 03/10/2020   MCV 85 03/10/2020   PLT 319 03/10/2020   Lab Results  Component Value Date   NA 139 03/10/2020   K 4.3 03/10/2020   CO2 21 03/10/2020   GLUCOSE 84 03/10/2020   BUN 12 03/10/2020   CREATININE 1.00 03/10/2020   CALCIUM 9.2 03/10/2020   Lab Results  Component Value Date   CHOL 181 03/10/2020   Lab Results  Component Value Date   HDL 70 03/10/2020   Lab Results  Component Value Date   LDLCALC 97 03/10/2020   Lab Results  Component Value Date   TRIG 74 03/10/2020   Lab Results  Component Value Date   CHOLHDL 2.6 03/10/2020   No results found for: HGBA1C    Assessment & Plan:   Problem List Items Addressed This Visit      Respiratory   Allergic rhinitis   Relevant Medications   montelukast (SINGULAIR) 10 MG tablet     Other   Anxiety    Well-controlled on current medication Xanax 0.5 mg 1 tablet by mouth twice daily as needed.  Provided education to patient on stress management, printed handouts given.  Rx sent to pharmacy.  Follow-up with unresolved or worsening symptoms.      Relevant Medications   ALPRAZolam (XANAX) 0.5 MG tablet   Medication  management contract signed   Relevant Orders   ToxASSURE Select 13 (MW), Urine   Back pain without sciatica - Primary    Back pain not well controlled started patient on naproxen 500 mg twice daily as needed, weight loss advice, Flexeril for muscle spasm, rest joints, warm or cold compress as tolerated.  Follow-up with worsening or unresolved symptoms  Rx sent to pharmacy.      Relevant Medications   naproxen (NAPROSYN) 500 MG tablet  cyclobenzaprine (FLEXERIL) 5 MG tablet   BMI 40.0-44.9, adult (HCC)   Relevant Medications   liraglutide (VICTOZA) 18 MG/3ML SOPN   Abdominal obesity and metabolic syndrome    Weight not well controlled.  Started patient on Saxenda 0.6 mg tablet by mouth daily for 1 week, increase by 0.6 mg/day and weekly intervals until dose of 3 mg/day achieved.  Will discontinue if patient cannot tolerate or will reevaluate change in body weight 16 weeks after initiating Saxenda.  Will discontinue Saxenda if the patient has not lost at least 4% of baseline body weight.  Education provided to patient with printed handouts given.  Patient verbalized understanding.       Relevant Medications   liraglutide (VICTOZA) 18 MG/3ML SOPN      Meds ordered this encounter  Medications  . liraglutide (VICTOZA) 18 MG/3ML SOPN    Sig: Inject 0.6 mg into the skin every morning. For 1 week, increase by 0.6 mg/day in weekly intervals until her dose of 3 mg/day achieved.    Dispense:  3 mL    Refill:  2    Order Specific Question:   Supervising Provider    Answer:   Caryl Pina A [id number]  . naproxen (NAPROSYN) 500 MG tablet    Sig: Take 1 tablet (500 mg total) by mouth 2 (two) times daily with a meal.    Dispense:  60 tablet    Refill:  0    Order Specific Question:   Supervising Provider    Answer:   Caryl Pina A [id number]  . montelukast (SINGULAIR) 10 MG tablet    Sig: Take 1 tablet (10 mg total) by mouth at bedtime. As needed    Dispense:  30 tablet     Refill:  2    Order Specific Question:   Supervising Provider    Answer:   Caryl Pina A [id number]  . fluticasone (FLONASE) 50 MCG/ACT nasal spray    Sig: Place 2 sprays into both nostrils daily.    Dispense:  16 g    Refill:  6    Order Specific Question:   Supervising Provider    Answer:   Caryl Pina A [id number]  . azelastine (ASTELIN) 0.1 % nasal spray    Sig: Place 1 spray into both nostrils 2 (two) times daily. Use in each nostril as directed    Dispense:  30 mL    Refill:  0    Order Specific Question:   Supervising Provider    Answer:   Caryl Pina A [id number]  . DISCONTD: ALPRAZolam (XANAX) 0.5 MG tablet    Sig: Take 1 tablet (0.5 mg total) by mouth 2 (two) times daily as needed for anxiety.    Dispense:  60 tablet    Refill:  1    Order Specific Question:   Supervising Provider    Answer:   Caryl Pina A [id number]  . cyclobenzaprine (FLEXERIL) 5 MG tablet    Sig: Take 1 tablet (5 mg total) by mouth 3 (three) times daily as needed for muscle spasms.    Dispense:  60 tablet    Refill:  1    Order Specific Question:   Supervising Provider    Answer:   Caryl Pina A [id number]  . ALPRAZolam (XANAX) 0.5 MG tablet    Sig: Take 1 tablet (0.5 mg total) by mouth 2 (two) times daily as needed for anxiety.    Dispense:  60 tablet    Refill:  0  Order Specific Question:   Supervising Provider    Answer:   Caryl Pina A [[id number]]    Follow-up: Return if symptoms worsen or fail to improve.    Ivy Lynn, NP

## 2020-06-09 NOTE — Telephone Encounter (Signed)
Typo on my end.  Corrections made Rx resent to pharmacy.

## 2020-06-09 NOTE — Addendum Note (Signed)
Addended by: Ivy Lynn on: 06/09/2020 05:08 PM   Modules accepted: Orders

## 2020-06-17 LAB — TOXASSURE SELECT 13 (MW), URINE

## 2020-09-25 ENCOUNTER — Other Ambulatory Visit: Payer: Self-pay | Admitting: Nurse Practitioner

## 2020-09-25 DIAGNOSIS — Z1231 Encounter for screening mammogram for malignant neoplasm of breast: Secondary | ICD-10-CM

## 2020-10-22 ENCOUNTER — Ambulatory Visit: Payer: Self-pay

## 2020-10-22 ENCOUNTER — Encounter: Payer: Self-pay | Admitting: Nurse Practitioner

## 2020-10-22 ENCOUNTER — Other Ambulatory Visit: Payer: Self-pay

## 2020-10-22 ENCOUNTER — Ambulatory Visit
Admission: RE | Admit: 2020-10-22 | Discharge: 2020-10-22 | Disposition: A | Discharge status: Home / Self Care | Payer: 59 | Source: Ambulatory Visit | Attending: Nurse Practitioner | Admitting: Nurse Practitioner

## 2020-10-22 ENCOUNTER — Other Ambulatory Visit (HOSPITAL_COMMUNITY)
Admission: RE | Admit: 2020-10-22 | Discharge: 2020-10-22 | Disposition: A | Discharge status: Home / Self Care | Payer: 59 | Source: Ambulatory Visit | Attending: Nurse Practitioner | Admitting: Nurse Practitioner

## 2020-10-22 ENCOUNTER — Ambulatory Visit (INDEPENDENT_AMBULATORY_CARE_PROVIDER_SITE_OTHER): Payer: 59 | Admitting: Nurse Practitioner

## 2020-10-22 VITALS — BP 110/82 | HR 74 | Temp 98.0°F | Ht 68.0 in

## 2020-10-22 DIAGNOSIS — M5489 Other dorsalgia: Secondary | ICD-10-CM

## 2020-10-22 DIAGNOSIS — Z0000 Encounter for general adult medical examination without abnormal findings: Secondary | ICD-10-CM | POA: Insufficient documentation

## 2020-10-22 DIAGNOSIS — Z0001 Encounter for general adult medical examination with abnormal findings: Secondary | ICD-10-CM | POA: Diagnosis not present

## 2020-10-22 DIAGNOSIS — F419 Anxiety disorder, unspecified: Secondary | ICD-10-CM

## 2020-10-22 DIAGNOSIS — J309 Allergic rhinitis, unspecified: Secondary | ICD-10-CM

## 2020-10-22 DIAGNOSIS — Z1231 Encounter for screening mammogram for malignant neoplasm of breast: Secondary | ICD-10-CM

## 2020-10-22 MED ORDER — ALPRAZOLAM 0.5 MG PO TABS: 0.5000 mg | ORAL_TABLET | Freq: Two times a day (BID) | ORAL | 0 refills | Status: DC | PRN

## 2020-10-22 MED ORDER — CYCLOBENZAPRINE HCL 5 MG PO TABS: 5.0000 mg | ORAL_TABLET | Freq: Three times a day (TID) | ORAL | 1 refills | Status: DC | PRN

## 2020-10-22 MED ORDER — FLUTICASONE PROPIONATE 50 MCG/ACT NA SUSP: 2.0000 | Freq: Every day | NASAL | 6 refills | Status: DC

## 2020-10-22 MED ORDER — NAPROXEN 500 MG PO TABS: 500.0000 mg | ORAL_TABLET | Freq: Two times a day (BID) | ORAL | 0 refills | Status: DC

## 2020-10-22 MED ORDER — MONTELUKAST SODIUM 10 MG PO TABS: 10.0000 mg | ORAL_TABLET | Freq: Every day | ORAL | 2 refills | Status: DC

## 2020-10-22 MED ORDER — AZELASTINE HCL 0.1 % NA SOLN: 1.0000 | Freq: Two times a day (BID) | NASAL | 0 refills | Status: DC

## 2020-10-22 NOTE — Assessment & Plan Note (Signed)
Symptoms well managed on Xanax 0.5 mg tablet by mouth twice daily as needed.  Rx sent to pharmacy

## 2020-10-22 NOTE — Patient Instructions (Signed)
Health Maintenance, Female Adopting a healthy lifestyle and getting preventive care are important in promoting health and wellness. Ask your health care provider about:  The right schedule for you to have regular tests and exams.  Things you can do on your own to prevent diseases and keep yourself healthy. What should I know about diet, weight, and exercise? Eat a healthy diet  Eat a diet that includes plenty of vegetables, fruits, low-fat dairy products, and lean protein.  Do not eat a lot of foods that are high in solid fats, added sugars, or sodium.   Maintain a healthy weight Body mass index (BMI) is used to identify weight problems. It estimates body fat based on height and weight. Your health care provider can help determine your BMI and help you achieve or maintain a healthy weight. Get regular exercise Get regular exercise. This is one of the most important things you can do for your health. Most adults should:  Exercise for at least 150 minutes each week. The exercise should increase your heart rate and make you sweat (moderate-intensity exercise).  Do strengthening exercises at least twice a week. This is in addition to the moderate-intensity exercise.  Spend less time sitting. Even light physical activity can be beneficial. Watch cholesterol and blood lipids Have your blood tested for lipids and cholesterol at 41 years of age, then have this test every 5 years. Have your cholesterol levels checked more often if:  Your lipid or cholesterol levels are high.  You are older than 40 years of age.  You are at high risk for heart disease. What should I know about cancer screening? Depending on your health history and family history, you may need to have cancer screening at various ages. This may include screening for:  Breast cancer.  Cervical cancer.  Colorectal cancer.  Skin cancer.  Lung cancer. What should I know about heart disease, diabetes, and high blood  pressure? Blood pressure and heart disease  High blood pressure causes heart disease and increases the risk of stroke. This is more likely to develop in people who have high blood pressure readings, are of African descent, or are overweight.  Have your blood pressure checked: ? Every 3-5 years if you are 18-39 years of age. ? Every year if you are 40 years old or older. Diabetes Have regular diabetes screenings. This checks your fasting blood sugar level. Have the screening done:  Once every three years after age 40 if you are at a normal weight and have a low risk for diabetes.  More often and at a younger age if you are overweight or have a high risk for diabetes. What should I know about preventing infection? Hepatitis B If you have a higher risk for hepatitis B, you should be screened for this virus. Talk with your health care provider to find out if you are at risk for hepatitis B infection. Hepatitis C Testing is recommended for:  Everyone born from 1945 through 1965.  Anyone with known risk factors for hepatitis C. Sexually transmitted infections (STIs)  Get screened for STIs, including gonorrhea and chlamydia, if: ? You are sexually active and are younger than 41 years of age. ? You are older than 41 years of age and your health care provider tells you that you are at risk for this type of infection. ? Your sexual activity has changed since you were last screened, and you are at increased risk for chlamydia or gonorrhea. Ask your health care provider   if you are at risk.  Ask your health care provider about whether you are at high risk for HIV. Your health care provider may recommend a prescription medicine to help prevent HIV infection. If you choose to take medicine to prevent HIV, you should first get tested for HIV. You should then be tested every 3 months for as long as you are taking the medicine. Pregnancy  If you are about to stop having your period (premenopausal) and  you may become pregnant, seek counseling before you get pregnant.  Take 400 to 800 micrograms (mcg) of folic acid every day if you become pregnant.  Ask for birth control (contraception) if you want to prevent pregnancy. Osteoporosis and menopause Osteoporosis is a disease in which the bones lose minerals and strength with aging. This can result in bone fractures. If you are 65 years old or older, or if you are at risk for osteoporosis and fractures, ask your health care provider if you should:  Be screened for bone loss.  Take a calcium or vitamin D supplement to lower your risk of fractures.  Be given hormone replacement therapy (HRT) to treat symptoms of menopause. Follow these instructions at home: Lifestyle  Do not use any products that contain nicotine or tobacco, such as cigarettes, e-cigarettes, and chewing tobacco. If you need help quitting, ask your health care provider.  Do not use street drugs.  Do not share needles.  Ask your health care provider for help if you need support or information about quitting drugs. Alcohol use  Do not drink alcohol if: ? Your health care provider tells you not to drink. ? You are pregnant, may be pregnant, or are planning to become pregnant.  If you drink alcohol: ? Limit how much you use to 0-1 drink a day. ? Limit intake if you are breastfeeding.  Be aware of how much alcohol is in your drink. In the [place] one drink equals one 12 oz bottle of beer (355 mL), one 5 oz glass of wine (148 mL), or one 1 oz glass of hard liquor (44 mL). General instructions  Schedule regular health, dental, and eye exams.  Stay current with your vaccines.  Tell your health care provider if: ? You often feel depressed. ? You have ever been abused or do not feel safe at home. Summary  Adopting a healthy lifestyle and getting preventive care are important in promoting health and wellness.  Follow your health care provider's instructions about healthy  diet, exercising, and getting tested or screened for diseases.  Follow your health care provider's instructions on monitoring your cholesterol and blood pressure. This information is not intended to replace advice given to you by your health care provider. Make sure you discuss any questions you have with your health care provider. Document Revised: 06/07/2018 Document Reviewed: 06/07/2018 Elsevier Patient Education  2021 [employer]  

## 2020-10-22 NOTE — Assessment & Plan Note (Signed)
Symptoms managed with naproxen 500 mg tablet by mouth twice daily and Flexeril 5 mg tablet by mouth 3 times daily as needed for spasms with meal.  Rx refill sent to pharmacy.

## 2020-10-22 NOTE — Progress Notes (Signed)
Established Patient Office Visit  Subjective:  Patient ID: Gloria Carlson, female    DOB: [birth year]  Age: 40 y.o. MRN: [id number]  CC:  Chief Complaint  Patient presents with  . Annual Exam    HPI Gloria Carlson presents for Encounter for general adult medical examination Physical : Patient's last physical exam was 1 year ago .  Weight: Not appropriate for height (BMI greater than 27%) ;  Blood Pressure: Normal (BP less than 120/80) ; yes Medical History: Patient history reviewed ; Family history reviewed ; yes Allergies Reviewed: No change in current allergies ; yes Medications Reviewed: Medications reviewed - no changes ; yes Lipids: Normal lipid levels ; yes Smoking: Life-long non-smoker ; yes Physical Activity: Exercises at least 3 times per week ; as tolerated Alcohol/Drug Use: Is a non-drinker ; No illicit drug use ; no Patient is not afflicted from Stress Incontinence and Urge Incontinence Safety: reviewed ; Patient wears a seat belt, has smoke detectors, has carbon monoxide detectors, practices appropriate gun safety, and wears sunscreen with extended sun exposure.  If Dental Care: biannual cleanings, brushes and flosses daily. Ophthalmology/Optometry: Annual visit.  Hearing loss: none Vision impairments: none  Past Medical History:  Diagnosis Date  . Anxiety     No past surgical history on file.  Family History  Problem Relation Age of Onset  . Hyperlipidemia Mother   . Hypertension Father     Social History   Socioeconomic History  . Marital status: Single    Spouse name: Not on file  . Number of children: Not on file  . Years of education: Not on file  . Highest education level: Not on file  Occupational History  . Not on file  Tobacco Use  . Smoking status: Former Research scientist (life sciences)  . Smokeless tobacco: Never Used  Vaping Use  . Vaping Use: Never used  Substance and Sexual Activity  . Alcohol use: Yes  . Drug use: No  . Sexual activity: Not on file   Other Topics Concern  . Not on file  Social History Narrative  . Not on file   Social Determinants of Health   Financial Resource Strain: Not on file  Food Insecurity: Not on file  Transportation Needs: Not on file  Physical Activity: Not on file  Stress: Not on file  Social Connections: Not on file  Intimate Partner Violence: Not on file    Outpatient Medications Prior to Visit  Medication Sig Dispense Refill  . ALPRAZolam (XANAX) 0.5 MG tablet Take 1 tablet (0.5 mg total) by mouth 2 (two) times daily as needed for anxiety. 60 tablet 0  . azelastine (ASTELIN) 0.1 % nasal spray Place 1 spray into both nostrils 2 (two) times daily. Use in each nostril as directed 30 mL 0  . cetirizine (ZYRTEC) 10 MG tablet Take 10 mg by mouth daily.    . cyclobenzaprine (FLEXERIL) 5 MG tablet Take 1 tablet (5 mg total) by mouth 3 (three) times daily as needed for muscle spasms. 60 tablet 1  . fluticasone (FLONASE) 50 MCG/ACT nasal spray Place 2 sprays into both nostrils daily. 16 g 6  . montelukast (SINGULAIR) 10 MG tablet Take 1 tablet (10 mg total) by mouth at bedtime. As needed 30 tablet 2  . naproxen (NAPROSYN) 500 MG tablet Take 1 tablet (500 mg total) by mouth 2 (two) times daily with a meal. 60 tablet 0  . paragard intrauterine copper IUD IUD by Intrauterine route.    . liraglutide ([place])  18 MG/3ML SOPN Inject 0.6 mg into the skin every morning. For 1 week, increase by 0.6 mg/day in weekly intervals. Follow up in 12 weeks (Patient not taking: Reported on 10/22/2020) 9 mL 0   No facility-administered medications prior to visit.    Allergies  Allergen Reactions  . Macrodantin [Nitrofurantoin Macrocrystal] Itching and Rash    ROS Review of Systems  Constitutional: Negative.   HENT: Negative.   Eyes: Negative.   Respiratory: Negative.   Cardiovascular: Negative.   Gastrointestinal: Negative.   Genitourinary: Negative.   Musculoskeletal: Negative.   Skin: Negative.   Neurological:  Negative.   Hematological: Negative.   Psychiatric/Behavioral: Negative.   All other systems reviewed and are negative.     Objective:    Physical Exam Vitals and nursing note reviewed.  Constitutional:      Appearance: Normal appearance.  HENT:     Head: Normocephalic.     Right Ear: Ear canal and external ear normal. There is no impacted cerumen.     Left Ear: Ear canal normal. There is no impacted cerumen.     Nose: Nose normal.     Mouth/Throat:     Mouth: Mucous membranes are moist.     Pharynx: Oropharynx is clear.  Eyes:     Conjunctiva/sclera: Conjunctivae normal.     Pupils: Pupils are equal, round, and reactive to light.  Cardiovascular:     Rate and Rhythm: Normal rate and regular rhythm.     Pulses: Normal pulses.     Heart sounds: Normal heart sounds.  Pulmonary:     Effort: Pulmonary effort is normal.     Breath sounds: Normal breath sounds.  Abdominal:     General: Bowel sounds are normal. There is no distension.     Tenderness: There is no abdominal tenderness.  Genitourinary:    General: Normal vulva.     Pubic Area: No rash.      Vagina: No vaginal discharge.  Musculoskeletal:        General: Normal range of motion.     Cervical back: Normal range of motion.  Skin:    General: Skin is warm.  Neurological:     Mental Status: She is alert and oriented to person, place, and time.  Psychiatric:        Behavior: Behavior normal.     BP 110/82   Pulse 74   Temp 98 F (36.7 C) (Temporal)   Ht 5\' 8"  (1.727 m)   LMP 09/30/2020 (Exact Date)   SpO2 100%   BMI 43.03 kg/m  Wt Readings from Last 3 Encounters:  06/09/20 283 lb (128.4 kg)  03/10/20 279 lb (126.6 kg)  08/15/17 195 lb 12.8 oz (88.8 kg)     Health Maintenance Due  Topic Date Due  . COVID-19 Vaccine (3 - Booster for Moderna series) 07/03/2020    There are no preventive care reminders to display for this patient.  Lab Results  Component Value Date   TSH 1.120 03/10/2020   Lab  Results  Component Value Date   WBC 6.8 03/10/2020   HGB 11.6 03/10/2020   HCT 37.2 03/10/2020   MCV 85 03/10/2020   PLT 319 03/10/2020   Lab Results  Component Value Date   NA 139 03/10/2020   K 4.3 03/10/2020   CO2 21 03/10/2020   GLUCOSE 84 03/10/2020   BUN 12 03/10/2020   CREATININE 1.00 03/10/2020   CALCIUM 9.2 03/10/2020   Lab Results  Component Value Date  CHOL 181 03/10/2020   Lab Results  Component Value Date   HDL 70 03/10/2020   Lab Results  Component Value Date   LDLCALC 97 03/10/2020   Lab Results  Component Value Date   TRIG 74 03/10/2020   Lab Results  Component Value Date   CHOLHDL 2.6 03/10/2020   No results found for: HGBA1C    Assessment & Plan:  Annual physical exam Completed head to toe assessment.  Provided education for health maintenance and preventative care.  Continue healthy diet and exercise regimen as tolerated at least 3 times a week.  No labs completed today.  Talked about stress and anxiety management.  PAP/ mammogram completed.  Results pending.  Back pain without sciatica Symptoms managed with naproxen 500 mg tablet by mouth twice daily and Flexeril 5 mg tablet by mouth 3 times daily as needed for spasms with meal.  Rx refill sent to pharmacy.  Anxiety Symptoms well managed on Xanax 0.5 mg tablet by mouth twice daily as needed.  Rx sent to pharmacy   Allergic rhinitis Continue Singulair 10 mg tablet by mouth at bedtime as needed.  Rx sent to pharmacy.  GAD 7 : Generalized Anxiety Score 10/22/2020 06/09/2020 03/10/2020  Nervous, Anxious, on Edge 1 1 1   Control/stop worrying 1 1 0  Worry too much - different things 1 1 0  Trouble relaxing 2 0 0  Restless 0 0 0  Easily annoyed or irritable 1 1 1   Afraid - awful might happen 0 1 1  Total GAD 7 Score 6 5 3   Anxiety Difficulty Not difficult at all Somewhat difficult -    Problem List Items Addressed This Visit      Respiratory   Allergic rhinitis   Relevant  Medications   azelastine (ASTELIN) 0.1 % nasal spray   fluticasone (FLONASE) 50 MCG/ACT nasal spray   montelukast (SINGULAIR) 10 MG tablet     Other   Anxiety   Relevant Medications   ALPRAZolam (XANAX) 0.5 MG tablet   Back pain without sciatica   Relevant Medications   cyclobenzaprine (FLEXERIL) 5 MG tablet   naproxen (NAPROSYN) 500 MG tablet   Annual physical exam - Primary   Relevant Orders   Cytology - PAP([place])   Cologuard      Meds ordered this encounter  Medications  . azelastine (ASTELIN) 0.1 % nasal spray    Sig: Place 1 spray into both nostrils 2 (two) times daily. Use in each nostril as directed    Dispense:  30 mL    Refill:  0    Order Specific Question:   Supervising Provider    Answer:   Janora Norlander [[id number]]  . cyclobenzaprine (FLEXERIL) 5 MG tablet    Sig: Take 1 tablet (5 mg total) by mouth 3 (three) times daily as needed for muscle spasms.    Dispense:  60 tablet    Refill:  1    Order Specific Question:   Supervising Provider    Answer:   Janora Norlander [[id number]]  . fluticasone (FLONASE) 50 MCG/ACT nasal spray    Sig: Place 2 sprays into both nostrils daily.    Dispense:  16 g    Refill:  6    Order Specific Question:   Supervising Provider    Answer:   Janora Norlander [[id number]]  . montelukast (SINGULAIR) 10 MG tablet    Sig: Take 1 tablet (10 mg total) by mouth at bedtime. As needed  Dispense:  30 tablet    Refill:  2    Order Specific Question:   Supervising Provider    Answer:   Raliegh Ip [[id number]]  . naproxen (NAPROSYN) 500 MG tablet    Sig: Take 1 tablet (500 mg total) by mouth 2 (two) times daily with a meal.    Dispense:  60 tablet    Refill:  0    Order Specific Question:   Supervising Provider    Answer:   Raliegh Ip [[id number]]  . ALPRAZolam (XANAX) 0.5 MG tablet    Sig: Take 1 tablet (0.5 mg total) by mouth 2 (two) times daily as needed for anxiety.    Dispense:  60 tablet    Refill:  0     Order Specific Question:   Supervising Provider    Answer:   Raliegh Ip [[id number]]    Follow-up: Return in about 1 year (around 10/22/2021).    Daryll Drown, NP

## 2020-10-22 NOTE — Assessment & Plan Note (Signed)
Completed head to toe assessment.  Provided education for health maintenance and preventative care.  Continue healthy diet and exercise regimen as tolerated at least 3 times a week.  No labs completed today.  Talked about stress and anxiety management.  PAP/ mammogram completed.  Results pending.

## 2020-10-22 NOTE — Assessment & Plan Note (Signed)
Continue Singulair 10 mg tablet by mouth at bedtime as needed.  Rx sent to pharmacy.

## 2020-10-23 LAB — CYTOLOGY - PAP
Adequacy: ABSENT
Diagnosis: NEGATIVE

## 2020-10-28 ENCOUNTER — Other Ambulatory Visit: Payer: Self-pay | Admitting: Nurse Practitioner

## 2020-10-28 DIAGNOSIS — R928 Other abnormal and inconclusive findings on diagnostic imaging of breast: Secondary | ICD-10-CM

## 2020-11-17 ENCOUNTER — Other Ambulatory Visit: Payer: Self-pay

## 2020-11-17 ENCOUNTER — Ambulatory Visit
Admission: RE | Admit: 2020-11-17 | Discharge: 2020-11-17 | Disposition: A | Discharge status: Home / Self Care | Payer: 59 | Source: Ambulatory Visit | Attending: Nurse Practitioner | Admitting: Nurse Practitioner

## 2020-11-17 DIAGNOSIS — R928 Other abnormal and inconclusive findings on diagnostic imaging of breast: Secondary | ICD-10-CM

## 2021-03-13 ENCOUNTER — Ambulatory Visit (INDEPENDENT_AMBULATORY_CARE_PROVIDER_SITE_OTHER): Payer: Self-pay | Admitting: Nurse Practitioner

## 2021-03-13 ENCOUNTER — Encounter: Payer: Self-pay | Admitting: Nurse Practitioner

## 2021-03-13 DIAGNOSIS — J069 Acute upper respiratory infection, unspecified: Secondary | ICD-10-CM

## 2021-03-13 MED ORDER — ACETAMINOPHEN 500 MG PO TABS: 500.0000 mg | ORAL_TABLET | Freq: Four times a day (QID) | ORAL | 0 refills | Status: DC | PRN

## 2021-03-13 MED ORDER — SALINE SPRAY 0.65 % NA SOLN
1.0000 | NASAL | 3 refills | Status: DC | PRN
Start: 2021-03-13 — End: 2022-02-09

## 2021-03-13 NOTE — Progress Notes (Signed)
   Virtual Visit  Note Due to COVID-19 pandemic this visit was conducted virtually. This visit type was conducted due to national recommendations for restrictions regarding the COVID-19 Pandemic (e.g. social distancing, sheltering in place) in an effort to limit this patient's exposure and mitigate transmission in our community. All issues noted in this document were discussed and addressed.  A physical exam was not performed with this format.  I connected with Gloria Carlson on 03/13/21 at 09:40 am  by telephone and verified that I am speaking with the correct person using two identifiers. Gloria Carlson is currently located at home  during visit. The provider, Ivy Lynn, NP is located in their office at time of visit.  I discussed the limitations, risks, security and privacy concerns of performing an evaluation and management service by telephone and the availability of in person appointments. I also discussed with the patient that there may be a patient responsible charge related to this service. The patient expressed understanding and agreed to proceed.   History and Present Illness:  URI  This is a new problem. Episode onset: 2-3 days. The problem has been unchanged. There has been no fever. Associated symptoms include congestion, coughing and headaches. Pertinent negatives include no nausea, rash or vomiting. She has tried nothing for the symptoms.     Review of Systems  Constitutional:  Positive for malaise/fatigue. Negative for chills and fever.  HENT:  Positive for congestion.   Eyes: Negative.   Respiratory:  Positive for cough.   Gastrointestinal:  Negative for nausea and vomiting.  Skin:  Negative for rash.  Neurological:  Positive for headaches.    Observations/Objective: Televisit patient not in distress.  Assessment and Plan: Take meds as prescribed - Use a cool mist humidifier  -Use saline nose sprays frequently -Force fluids -For fever or aches or pains- take  Tylenol or ibuprofen. -If symptoms do not improve, she may need to be COVID tested to rule this out   Follow Up Instructions: Follow-up with unresolved symptoms.    I discussed the assessment and treatment plan with the patient. The patient was provided an opportunity to ask questions and all were answered. The patient agreed with the plan and demonstrated an understanding of the instructions.   The patient was advised to call back or seek an in-person evaluation if the symptoms worsen or if the condition fails to improve as anticipated.  The above assessment and management plan was discussed with the patient. The patient verbalized understanding of and has agreed to the management plan. Patient is aware to call the clinic if symptoms persist or worsen. Patient is aware when to return to the clinic for a follow-up visit. Patient educated on when it is appropriate to go to the emergency department.   Time call ended: 9:50 AM  I provided 10 minutes of  non face-to-face time during this encounter.    Ivy Lynn, NP

## 2021-03-13 NOTE — Assessment & Plan Note (Signed)
Take meds as prescribed - Use a cool mist humidifier  -Use saline nose sprays  -Force fluids -For fever or aches or pains- take Tylenol or ibuprofen. -If symptoms do not improve, she may need to be COVID tested to rule this out Follow up with worsening unresolved symptoms

## 2021-06-08 ENCOUNTER — Encounter: Payer: Self-pay | Admitting: *Deleted

## 2021-06-08 ENCOUNTER — Ambulatory Visit (INDEPENDENT_AMBULATORY_CARE_PROVIDER_SITE_OTHER): Payer: Self-pay | Admitting: Nurse Practitioner

## 2021-06-08 ENCOUNTER — Encounter: Payer: Self-pay | Admitting: Nurse Practitioner

## 2021-06-08 VITALS — BP 105/72 | HR 82 | Temp 98.0°F | Resp 20 | Ht 68.0 in | Wt 286.0 lb

## 2021-06-08 DIAGNOSIS — Z79899 Other long term (current) drug therapy: Secondary | ICD-10-CM

## 2021-06-08 DIAGNOSIS — N946 Dysmenorrhea, unspecified: Secondary | ICD-10-CM | POA: Insufficient documentation

## 2021-06-08 DIAGNOSIS — R102 Pelvic and perineal pain: Secondary | ICD-10-CM | POA: Insufficient documentation

## 2021-06-08 DIAGNOSIS — M5489 Other dorsalgia: Secondary | ICD-10-CM

## 2021-06-08 DIAGNOSIS — J309 Allergic rhinitis, unspecified: Secondary | ICD-10-CM

## 2021-06-08 LAB — URINALYSIS, ROUTINE W REFLEX MICROSCOPIC
Bilirubin, UA: NEGATIVE
Glucose, UA: NEGATIVE
Ketones, UA: NEGATIVE
Nitrite, UA: NEGATIVE
Protein,UA: NEGATIVE
Specific Gravity, UA: 1.02 (ref 1.005–1.030)
Urobilinogen, Ur: 0.2 mg/dL (ref 0.2–1.0)
pH, UA: 5.5 (ref 5.0–7.5)

## 2021-06-08 LAB — MICROSCOPIC EXAMINATION: Renal Epithel, UA: NONE SEEN /hpf

## 2021-06-08 MED ORDER — MONTELUKAST SODIUM 10 MG PO TABS: 10.0000 mg | ORAL_TABLET | Freq: Every day | ORAL | 2 refills | Status: DC

## 2021-06-08 MED ORDER — NAPROXEN 500 MG PO TABS: 500.0000 mg | ORAL_TABLET | Freq: Two times a day (BID) | ORAL | 0 refills | Status: DC

## 2021-06-08 MED ORDER — CYCLOBENZAPRINE HCL 5 MG PO TABS: 5.0000 mg | ORAL_TABLET | Freq: Three times a day (TID) | ORAL | 1 refills | Status: DC | PRN

## 2021-06-08 NOTE — Progress Notes (Signed)
Acute Office Visit  Subjective:    Patient ID: Gloria Carlson, female    DOB: 1981-06-06, 40 y.o.   MRN: [id number]  Chief Complaint  Patient presents with   menstral changes    HPI Patient is in today for patient presents with menorrhagia and cramping with blood clots during menstrual periods.  She reports periods are lasting up to 7 days associated with pelvic pain.  No fever, headache, nausea or vomiting associated with symptoms.  Past Medical History:  Diagnosis Date   Anxiety     History reviewed. No pertinent surgical history.  Family History  Problem Relation Age of Onset   Hyperlipidemia Mother    Hypertension Father     Social History   Socioeconomic History   Marital status: Single    Spouse name: Not on file   Number of children: Not on file   Years of education: Not on file   Highest education level: Not on file  Occupational History   Not on file  Tobacco Use   Smoking status: Former   Smokeless tobacco: Never  Vaping Use   Vaping Use: Never used  Substance and Sexual Activity   Alcohol use: Yes   Drug use: No   Sexual activity: Not on file  Other Topics Concern   Not on file  Social History Narrative   Not on file   Social Determinants of Health   Financial Resource Strain: Not on file  Food Insecurity: Not on file  Transportation Needs: Not on file  Physical Activity: Not on file  Stress: Not on file  Social Connections: Not on file  Intimate Partner Violence: Not on file    Outpatient Medications Prior to Visit  Medication Sig Dispense Refill   acetaminophen (TYLENOL) 500 MG tablet Take 1 tablet (500 mg total) by mouth every 6 (six) hours as needed. 30 tablet 0   ALPRAZolam (XANAX) 0.5 MG tablet Take 1 tablet (0.5 mg total) by mouth 2 (two) times daily as needed for anxiety. 60 tablet 0   azelastine (ASTELIN) 0.1 % nasal spray Place 1 spray into both nostrils 2 (two) times daily. Use in each nostril as directed 30 mL 0   cetirizine  (ZYRTEC) 10 MG tablet Take 10 mg by mouth daily.     fluticasone (FLONASE) 50 MCG/ACT nasal spray Place 2 sprays into both nostrils daily. 16 g 6   paragard intrauterine copper IUD IUD by Intrauterine route.     sodium chloride (OCEAN) 0.65 % SOLN nasal spray Place 1 spray into both nostrils as needed for congestion. 30 mL 3   cyclobenzaprine (FLEXERIL) 5 MG tablet Take 1 tablet (5 mg total) by mouth 3 (three) times daily as needed for muscle spasms. 60 tablet 1   montelukast (SINGULAIR) 10 MG tablet Take 1 tablet (10 mg total) by mouth at bedtime. As needed 30 tablet 2   naproxen (NAPROSYN) 500 MG tablet Take 1 tablet (500 mg total) by mouth 2 (two) times daily with a meal. 60 tablet 0   liraglutide (VICTOZA) 18 MG/3ML SOPN Inject 0.6 mg into the skin every morning. For 1 week, increase by 0.6 mg/day in weekly intervals. Follow up in 12 weeks (Patient not taking: Reported on 10/22/2020) 9 mL 0   No facility-administered medications prior to visit.    Allergies  Allergen Reactions   Macrodantin [Nitrofurantoin Macrocrystal] Itching and Rash    Review of Systems  Constitutional: Negative.   HENT: Negative.    Respiratory: Negative.  Gastrointestinal: Negative.  Negative for abdominal distention, constipation and nausea.  Genitourinary: Negative.   Skin: Negative.  Negative for rash.  All other systems reviewed and are negative.     Objective:    Physical Exam Vitals and nursing note reviewed.  Constitutional:      Appearance: Normal appearance. She is obese.  HENT:     Head: Normocephalic.     Right Ear: External ear normal.     Left Ear: External ear normal.     Mouth/Throat:     Mouth: Mucous membranes are moist.     Pharynx: Oropharynx is clear.  Cardiovascular:     Pulses: Normal pulses.     Heart sounds: Normal heart sounds.  Pulmonary:     Effort: Pulmonary effort is normal.     Breath sounds: Normal breath sounds.  Abdominal:     General: Bowel sounds are normal.      Tenderness: There is abdominal tenderness in the right lower quadrant.    Musculoskeletal:        General: Normal range of motion.  Skin:    General: Skin is warm.     Findings: No rash.  Neurological:     Mental Status: She is alert and oriented to person, place, and time.  Psychiatric:        Mood and Affect: Mood normal.        Behavior: Behavior normal.    BP 105/72   Pulse 82   Temp 98 F (36.7 C)   Resp 20   Ht 5\' 8"  (1.727 m)   Wt 286 lb (129.7 kg)   SpO2 98%   BMI 43.49 kg/m  Wt Readings from Last 3 Encounters:  06/08/21 286 lb (129.7 kg)  06/09/20 283 lb (128.4 kg)  03/10/20 279 lb (126.6 kg)    Health Maintenance Due  Topic Date Due   COVID-19 Vaccine (3 - Booster for Moderna series) 02/26/2020   INFLUENZA VACCINE  01/26/2021    There are no preventive care reminders to display for this patient.   Lab Results  Component Value Date   TSH 1.120 03/10/2020   Lab Results  Component Value Date   WBC 6.8 03/10/2020   HGB 11.6 03/10/2020   HCT 37.2 03/10/2020   MCV 85 03/10/2020   PLT 319 03/10/2020   Lab Results  Component Value Date   NA 139 03/10/2020   K 4.3 03/10/2020   CO2 21 03/10/2020   GLUCOSE 84 03/10/2020   BUN 12 03/10/2020   CREATININE 1.00 03/10/2020   CALCIUM 9.2 03/10/2020   Lab Results  Component Value Date   CHOL 181 03/10/2020   Lab Results  Component Value Date   HDL 70 03/10/2020   Lab Results  Component Value Date   LDLCALC 97 03/10/2020   Lab Results  Component Value Date   TRIG 74 03/10/2020   Lab Results  Component Value Date   CHOLHDL 2.6 03/10/2020   No results found for: HGBA1C     Assessment & Plan:   Problem List Items Addressed This Visit       Respiratory   Allergic rhinitis   Relevant Medications   montelukast (SINGULAIR) 10 MG tablet     Genitourinary   Severe menstrual cramps    Worsening menorrhagia with clots during periods.  Symptoms have progressively worsened with pelvic  pain and left lower abdominal tenderness.  Referral to GYN completed to rule out fibroids and ovarian cysts.  Completed a urinalysis to  rule out UTI.  Results pending.  Anti-inflammatory [ibuprofen for Tylenol for pain] Patient currently has a copper IUD for birth control.  Follow-up with worsening unresolved symptoms.        Other   Back pain without sciatica    Back pain well-controlled on current medication.  No changes necessary sent to pharmacy.      Relevant Medications   naproxen (NAPROSYN) 500 MG tablet   cyclobenzaprine (FLEXERIL) 5 MG tablet   Pelvic pain    Completed urinalysis results pending.  Ibuprofen/Tylenol for pain as needed.  Follow-up with worsening symptoms.      Relevant Orders   Urinalysis, Routine w reflex microscopic (Completed)   CULTURE, URINE COMPREHENSIVE   Other Visit Diagnoses     Controlled substance agreement signed    -  Primary   Relevant Orders   ToxASSURE Select 13 (MW), Urine        Meds ordered this encounter  Medications   naproxen (NAPROSYN) 500 MG tablet    Sig: Take 1 tablet (500 mg total) by mouth 2 (two) times daily with a meal.    Dispense:  60 tablet    Refill:  0    Order Specific Question:   Supervising Provider    Answer:   Claretta Fraise [[id number]]   montelukast (SINGULAIR) 10 MG tablet    Sig: Take 1 tablet (10 mg total) by mouth at bedtime. As needed    Dispense:  30 tablet    Refill:  2    Order Specific Question:   Supervising Provider    Answer:   Claretta Fraise [[id number]]   cyclobenzaprine (FLEXERIL) 5 MG tablet    Sig: Take 1 tablet (5 mg total) by mouth 3 (three) times daily as needed for muscle spasms.    Dispense:  60 tablet    Refill:  1    Order Specific Question:   Supervising Provider    Answer:   Claretta Fraise [[id number]]     Ivy Lynn, NP

## 2021-06-08 NOTE — Assessment & Plan Note (Signed)
Back pain well-controlled on current medication.  No changes necessary sent to pharmacy.

## 2021-06-08 NOTE — Assessment & Plan Note (Signed)
Completed urinalysis results pending.  Ibuprofen/Tylenol for pain as needed.  Follow-up with worsening symptoms.

## 2021-06-08 NOTE — Assessment & Plan Note (Signed)
Worsening menorrhagia with clots during periods.  Symptoms have progressively worsened with pelvic pain and left lower abdominal tenderness.  Referral to GYN completed to rule out fibroids and ovarian cysts.  Completed a urinalysis to rule out UTI.  Results pending.  Anti-inflammatory [ibuprofen for Tylenol for pain] Patient currently has a copper IUD for birth control.  Follow-up with worsening unresolved symptoms.

## 2021-06-08 NOTE — Patient Instructions (Signed)
Dysmenorrhea Dysmenorrhea means cramps during your period (menstrual period) that cause pain in your lower belly (abdomen). The pain is caused by the tightening (contracting) of the muscles of the womb (uterus). The pain may be mild or very bad. Primary dysmenorrhea is cramps that last a couple of days when a woman starts having periods or soon after. As a woman gets older or has a baby, the cramps will usually lessen or disappear. Secondary dysmenorrhea begins later in life and is caused by other problems. What are the causes? This condition may be caused by problems with the: Tissue that lines the womb. This tissue may grow: Outside of the womb. Into the walls of the womb. Blood vessels in the area between your hip bones (pelvis). Tissue in the lower part of the womb (cervix), including growths (polyps). Muscles that hold up the womb. Bladder. Bowels. It can also be caused by cancer. Other causes include: A very tipped womb. The lower part of the womb having a small opening. Tumors in the womb that are not cancer. Pelvic inflammatory disease (PID). Scars from surgeries you have had. A cyst in the ovaries. An IUD (intrauterine device). What increases the risk? Being younger than age 44. Having started puberty early. Having irregular bleeding or heavy bleeding. Never having given birth. Having a family history of period cramps. Smoking or using products with nicotine. Having a high body weight or a low body weight. What are the signs or symptoms? Cramps and pain in the lower belly or lower back. A feeling of fullness in the lower belly. Periods lasting for longer than 7 days. Headaches. Bloating. Tiredness (fatigue). Feeling like you may vomit (nauseous) or vomiting. Watery poop (diarrhea) or loose poop (stool). Sweating. Dizziness. How is this treated? Treatment depends on the cause of the cramps. Treatment may include medicines, such as: Medicines for pain. Medicines for  bleeding. Body chemical (hormone) replacement therapy. Shots (injections) to stop the menstrual period. Birth control pills. An IUD. NSAIDs, such as ibuprofen. Other treatments may include: Surgeries. Procedures. Nerve stimulation. Doing exercises. Yoga and alternative treatments. Work with your doctor to find what treatments are best for you. Follow these instructions at home: Helping pain and cramping  If told, put heat on your lower back or belly when you have pain or cramps. Do this as often as told by your doctor. Use the heat source that your doctor recommends, such as a moist heat pack or a heating pad. Place a towel between your skin and the heat. Leave the heat on for 20-30 minutes. Take off the heat if your skin turns bright red. This is very important. If you cannot feel pain, heat, or cold, you have a greater risk of getting burned. Do not sleep with a heating pad. Exercise. Walking, swimming, or biking can help take away cramps. Massage your lower back or belly. This may help lessen pain. General instructions Take over-the-counter and prescription medicines only as told by your doctor. Ask your doctor if you should avoid driving or using machines while you are taking your medicine. Avoid alcohol and caffeine during and right before your period. These can make cramps worse. Do not smoke or use any products that contain nicotine or tobacco. If you need help quitting, ask your doctor. Keep all follow-up visits. Contact a doctor if: You have pain that gets worse. You have pain that does not get better with medicine. You have pain during sex. You feel like you may vomit or you vomit  during your period and medicine does not help. Get help right away if: You faint. Summary Dysmenorrhea means painful cramps during your period. Put heat on your lower back or belly when you have pain or cramps. Do exercises like walking, swimming, or biking to help with cramps. Contact a  doctor if you have pain during sex. This information is not intended to replace advice given to you by your health care provider. Make sure you discuss any questions you have with your health care provider. Document Revised: 01/30/2020 Document Reviewed: 01/30/2020 Elsevier Patient Education  2022 [employer].

## 2021-06-12 LAB — CULTURE, URINE COMPREHENSIVE

## 2021-06-13 LAB — TOXASSURE SELECT 13 (MW), URINE

## 2021-06-16 ENCOUNTER — Telehealth: Payer: Self-pay | Admitting: Nurse Practitioner

## 2021-06-16 DIAGNOSIS — N946 Dysmenorrhea, unspecified: Secondary | ICD-10-CM

## 2021-06-16 NOTE — Telephone Encounter (Signed)
Pcp off please review labs

## 2021-06-17 NOTE — Telephone Encounter (Signed)
If she is referring to urine - no infection. I do not see where a referral was placed. Okay to place referral as previously mentioned by PCP.

## 2021-06-18 NOTE — Telephone Encounter (Signed)
I am unable to refill Xanax for patient. I see no mention of this in the notes from the recent visit with her PCP. This is not something that can be filled outside of an appointment.   Also, if patient believes she needs some type of imaging completed at the [facility] in [place], she needs an actual order for the imaging. I cannot tell from the office visit notes what imaging was planned as it says a referral is being placed to GYN. I see that she previously went to [facility] in [place]. It would be my suggestion to get back established with them.

## 2021-06-18 NOTE — Telephone Encounter (Signed)
Left message for patient to call back and ask for Gloria Carlson.  

## 2021-06-18 NOTE — Telephone Encounter (Signed)
Spoke with patient and referral placed to GYN for her. Patient aware that Ardeen Fillers will return to office on 12/29 and we will let her review the refill for her Alprazolam at that time. Patient aware that if she has any more concerns to please call and ask for me or Roselyn Reef.

## 2021-06-18 NOTE — Telephone Encounter (Signed)
Patient aware and verbalizes understanding.  Wanting to know if her xanax can be called in since her drug test was fine.  Also states that she spoke to her OBGYN and she needs a referral to the [facility] in [place] to be seen since she might have fibroids.    Spoke with Blanch Media and she reviewed Je's note and nothing was mentioned about filled xanax so she will not be filling the rx.  Also states the referral needs to wait on PCP since she was not sure what kind of imaging JE is wanting to have done.

## 2021-06-30 ENCOUNTER — Other Ambulatory Visit: Payer: Self-pay | Admitting: Nurse Practitioner

## 2021-06-30 DIAGNOSIS — F419 Anxiety disorder, unspecified: Secondary | ICD-10-CM

## 2021-06-30 MED ORDER — ALPRAZOLAM 0.5 MG PO TABS: 0.5000 mg | ORAL_TABLET | Freq: Two times a day (BID) | ORAL | 0 refills | Status: DC | PRN

## 2021-06-30 NOTE — Progress Notes (Signed)
R/C labs

## 2021-06-30 NOTE — Telephone Encounter (Signed)
Left message informing pt that Xanax has been refilled by [facility]

## 2021-07-02 ENCOUNTER — Telehealth: Payer: Self-pay | Admitting: Family Medicine

## 2021-07-02 ENCOUNTER — Encounter: Payer: Self-pay | Admitting: Nurse Practitioner

## 2021-07-02 ENCOUNTER — Ambulatory Visit (INDEPENDENT_AMBULATORY_CARE_PROVIDER_SITE_OTHER): Payer: Self-pay | Admitting: Nurse Practitioner

## 2021-07-02 DIAGNOSIS — J069 Acute upper respiratory infection, unspecified: Secondary | ICD-10-CM

## 2021-07-02 MED ORDER — BENZONATATE 100 MG PO CAPS: 100.0000 mg | ORAL_CAPSULE | Freq: Three times a day (TID) | ORAL | 0 refills | Status: DC | PRN

## 2021-07-02 MED ORDER — PREDNISONE 20 MG PO TABS: 40.0000 mg | ORAL_TABLET | Freq: Every day | ORAL | 0 refills | Status: AC

## 2021-07-02 NOTE — Patient Instructions (Signed)

## 2021-07-02 NOTE — Progress Notes (Signed)
Virtual Visit  Note Due to COVID-19 pandemic this visit was conducted virtually. This visit type was conducted due to national recommendations for restrictions regarding the COVID-19 Pandemic (e.g. social distancing, sheltering in place) in an effort to limit this patient's exposure and mitigate transmission in our community. All issues noted in this document were discussed and addressed.  A physical exam was not performed with this format.  I connected with Gloria Carlson on 07/02/21 at 8:35 by telephone and verified that I am speaking with the correct person using two identifiers. Gloria Carlson is currently located at home and her husband is currently with her during visit. The provider, Mary-Margaret Hassell Done, FNP is located in their office at time of visit.  I discussed the limitations, risks, security and privacy concerns of performing an evaluation and management service by telephone and the availability of in person appointments. I also discussed with the patient that there may be a patient responsible charge related to this service. The patient expressed understanding and agreed to proceed.   History and Present Illness:  URI  This is a new problem. The current episode started in the past 7 days. The problem has been gradually worsening. Associated symptoms include congestion, coughing, rhinorrhea, sneezing and a sore throat. She has tried decongestant for the symptoms. The treatment provided mild relief. Home covid test is negative    Review of Systems  HENT:  Positive for congestion, rhinorrhea, sneezing and sore throat.   Respiratory:  Positive for cough.     Observations/Objective: Alert and oriented- answers all questions appropriately No distress Voice raspy Deep cough  Assessment and Plan: Gloria Carlson in today with chief complaint of No chief complaint on file.   1. Viral URI with cough 1. Take meds as prescribed 2. Use a cool mist humidifier especially during the  winter months and when heat has been humid. 3. Use saline nose sprays frequently 4. Saline irrigations of the nose can be very helpful if done frequently.  * 4X daily for 1 week*  * Use of a nettie pot can be helpful with this. Follow directions with this* 5. Drink plenty of fluids 6. Keep thermostat turn down low 7.For any cough or congestion- tessalon perles 8. For fever or aces or pains- take tylenol or ibuprofen appropriate for age and weight.  * for fevers greater than 101 orally you may alternate ibuprofen and tylenol every  3 hours.   Meds ordered this encounter  Medications   benzonatate (TESSALON PERLES) 100 MG capsule    Sig: Take 1 capsule (100 mg total) by mouth 3 (three) times daily as needed for cough.    Dispense:  20 capsule    Refill:  0    Order Specific Question:   Supervising Provider    Answer:   Caryl Pina A [[id number]]   predniSONE (DELTASONE) 20 MG tablet    Sig: Take 2 tablets (40 mg total) by mouth daily with breakfast for 5 days. 2 po daily for 5 days    Dispense:  10 tablet    Refill:  0    Order Specific Question:   Supervising Provider    Answer:   Caryl Pina A [[id number]]       Follow Up Instructions: prn    I discussed the assessment and treatment plan with the patient. The patient was provided an opportunity to ask questions and all were answered. The patient agreed with the plan and demonstrated an understanding of the instructions.  The patient was advised to call back or seek an in-person evaluation if the symptoms worsen or if the condition fails to improve as anticipated.  The above assessment and management plan was discussed with the patient. The patient verbalized understanding of and has agreed to the management plan. Patient is aware to call the clinic if symptoms persist or worsen. Patient is aware when to return to the clinic for a follow-up visit. Patient educated on when it is appropriate to go to the emergency  department.   Time call ended:  8:48  I provided 12 minutes of  non face-to-face time during this encounter.    Mary-Margaret Hassell Done, FNP

## 2021-09-24 ENCOUNTER — Ambulatory Visit (INDEPENDENT_AMBULATORY_CARE_PROVIDER_SITE_OTHER): Payer: No Typology Code available for payment source | Admitting: Family Medicine

## 2021-09-24 ENCOUNTER — Encounter: Payer: Self-pay | Admitting: Family Medicine

## 2021-09-24 ENCOUNTER — Other Ambulatory Visit (HOSPITAL_COMMUNITY)
Admission: RE | Admit: 2021-09-24 | Discharge: 2021-09-24 | Disposition: A | Discharge status: Home / Self Care | Payer: No Typology Code available for payment source | Source: Ambulatory Visit | Attending: Family Medicine | Admitting: Family Medicine

## 2021-09-24 VITALS — BP 106/70 | HR 74 | Temp 97.6°F | Ht 68.0 in | Wt 283.4 lb

## 2021-09-24 DIAGNOSIS — N760 Acute vaginitis: Secondary | ICD-10-CM

## 2021-09-24 DIAGNOSIS — N3090 Cystitis, unspecified without hematuria: Secondary | ICD-10-CM | POA: Diagnosis not present

## 2021-09-24 LAB — MICROSCOPIC EXAMINATION
RBC, Urine: NONE SEEN /hpf (ref 0–2)
Renal Epithel, UA: NONE SEEN /hpf

## 2021-09-24 LAB — URINALYSIS, COMPLETE
Bilirubin, UA: NEGATIVE
Glucose, UA: NEGATIVE
Ketones, UA: NEGATIVE
Nitrite, UA: POSITIVE — AB
Protein,UA: NEGATIVE
Specific Gravity, UA: 1.02 (ref 1.005–1.030)
Urobilinogen, Ur: 0.2 mg/dL (ref 0.2–1.0)
pH, UA: 7 (ref 5.0–7.5)

## 2021-09-24 LAB — WET PREP FOR TRICH, YEAST, CLUE
Clue Cell Exam: NEGATIVE
Trichomonas Exam: NEGATIVE
Yeast Exam: NEGATIVE

## 2021-09-24 MED ORDER — FLUCONAZOLE 150 MG PO TABS: 150.0000 mg | ORAL_TABLET | Freq: Once | ORAL | 0 refills | Status: AC

## 2021-09-24 MED ORDER — CIPROFLOXACIN HCL 500 MG PO TABS: 500.0000 mg | ORAL_TABLET | Freq: Two times a day (BID) | ORAL | 0 refills | Status: AC

## 2021-09-24 NOTE — Progress Notes (Addendum)
?  ? ?Subjective:  ?Patient ID: Gloria Carlson, female    DOB: January 09, 1981, 41 y.o.   MRN: [id number] ? ?Patient Care Team: ?Ivy Lynn, NP as PCP - General (Nurse Practitioner)  ? ?Chief Complaint:  Vaginitis and Cystitis ? ? ?HPI: ?Gloria Carlson is a 41 y.o. female presenting on 09/24/2021 for Vaginitis and Cystitis ? ? ?Dysuria  ?This is a new problem. The current episode started 1 to 4 weeks ago. The problem occurs intermittently. The problem has been unchanged. The quality of the pain is described as burning. The pain is mild. There has been no fever. She is Sexually active. There is No history of pyelonephritis. Associated symptoms include flank pain, frequency and urgency. Pertinent negatives include no chills or hematuria. She has tried nothing for the symptoms. The treatment provided no relief.  ?Vaginal Itching ?The patient's primary symptoms include genital itching. The patient's pertinent negatives include no pelvic pain or vaginal discharge. Primary symptoms comment: irritation. The problem occurs constantly. The problem has been unchanged. The pain is mild. Associated symptoms include dysuria, flank pain, frequency and urgency. Pertinent negatives include no abdominal pain, chills, fever, headaches or hematuria. She is sexually active. No, her partner does not have an STD.  ? ? ? ?Relevant past medical, surgical, family, and social history reviewed and updated as indicated.  ?Allergies and medications reviewed and updated. Data reviewed: Chart in Epic. ? ? ?Past Medical History:  ?Diagnosis Date  ? Anxiety   ? ? ?History reviewed. No pertinent surgical history. ? ?Social History  ? ?Socioeconomic History  ? Marital status: Single  ?  Spouse name: Not on file  ? Number of children: Not on file  ? Years of education: Not on file  ? Highest education level: Not on file  ?Occupational History  ? Not on file  ?Tobacco Use  ? Smoking status: Former  ? Smokeless tobacco: Never  ?Vaping Use  ? Vaping Use:  Never used  ?Substance and Sexual Activity  ? Alcohol use: Yes  ? Drug use: No  ? Sexual activity: Not on file  ?Other Topics Concern  ? Not on file  ?Social History Narrative  ? Not on file  ? ?Social Determinants of Health  ? ?Financial Resource Strain: Not on file  ?Food Insecurity: Not on file  ?Transportation Needs: Not on file  ?Physical Activity: Not on file  ?Stress: Not on file  ?Social Connections: Not on file  ?Intimate Partner Violence: Not on file  ? ? ?Outpatient Encounter Medications as of 09/24/2021  ?Medication Sig  ? acetaminophen (TYLENOL) 500 MG tablet Take 1 tablet (500 mg total) by mouth every 6 (six) hours as needed.  ? ALPRAZolam (XANAX) 0.5 MG tablet Take 1 tablet (0.5 mg total) by mouth 2 (two) times daily as needed for anxiety.  ? azelastine (ASTELIN) 0.1 % nasal spray Place 1 spray into both nostrils 2 (two) times daily. Use in each nostril as directed  ? benzonatate (TESSALON PERLES) 100 MG capsule Take 1 capsule (100 mg total) by mouth 3 (three) times daily as needed for cough.  ? cetirizine (ZYRTEC) 10 MG tablet Take 10 mg by mouth daily.  ? ciprofloxacin (CIPRO) 500 MG tablet Take 1 tablet (500 mg total) by mouth 2 (two) times daily for 5 days.  ? cyclobenzaprine (FLEXERIL) 5 MG tablet Take 1 tablet (5 mg total) by mouth 3 (three) times daily as needed for muscle spasms.  ? fluticasone (FLONASE) 50 MCG/ACT nasal spray Place 2  sprays into both nostrils daily.  ? montelukast (SINGULAIR) 10 MG tablet Take 1 tablet (10 mg total) by mouth at bedtime. As needed  ? naproxen (NAPROSYN) 500 MG tablet Take 1 tablet (500 mg total) by mouth 2 (two) times daily with a meal.  ? paragard intrauterine copper IUD IUD by Intrauterine route.  ? sodium chloride (OCEAN) 0.65 % SOLN nasal spray Place 1 spray into both nostrils as needed for congestion.  ? ?No facility-administered encounter medications on file as of 09/24/2021.  ? ? ?Allergies  ?Allergen Reactions  ? Macrodantin [Nitrofurantoin Macrocrystal]  Itching and Rash  ? ? ?Review of Systems  ?Constitutional:  Negative for chills, fatigue and fever.  ?Respiratory:  Negative for cough and shortness of breath.   ?Cardiovascular:  Negative for chest pain, palpitations and leg swelling.  ?Gastrointestinal:  Negative for abdominal pain.  ?Endocrine: Negative for polyuria.  ?Genitourinary:  Positive for dysuria, flank pain, frequency, urgency and vaginal pain (irritation). Negative for decreased urine volume, difficulty urinating, dyspareunia, enuresis, genital sores, hematuria, menstrual problem, pelvic pain, vaginal bleeding and vaginal discharge.  ?Musculoskeletal:  Negative for joint swelling.  ?Neurological:  Negative for weakness and headaches.  ?Psychiatric/Behavioral:  Negative for confusion.   ?All other systems reviewed and are negative. ? ?   ? ?Objective:  ?BP 106/70   Pulse 74   Temp 97.6 ?F (36.4 ?C)   Ht '5\' 8"'$  (1.727 m)   Wt 128.5 kg   SpO2 97%   BMI 43.09 kg/m?   ? ?Wt Readings from Last 3 Encounters:  ?09/24/21 128.5 kg  ?06/08/21 129.7 kg  ?06/09/20 128.4 kg  ? ? ?Physical Exam ?Vitals and nursing note reviewed.  ?Constitutional:   ?   Appearance: Normal appearance. She is obese.  ?HENT:  ?   Head: Normocephalic and atraumatic.  ?Eyes:  ?   Pupils: Pupils are equal, round, and reactive to light.  ?Cardiovascular:  ?   Rate and Rhythm: Normal rate and regular rhythm.  ?   Heart sounds: Normal heart sounds.  ?Pulmonary:  ?   Effort: Pulmonary effort is normal.  ?   Breath sounds: Normal breath sounds.  ?Abdominal:  ?   General: Bowel sounds are normal. There is no distension.  ?   Palpations: Abdomen is soft. There is no mass.  ?   Tenderness: There is no abdominal tenderness. There is no right CVA tenderness, left CVA tenderness, guarding or rebound.  ?   Hernia: No hernia is present.  ?Genitourinary: ?   Comments: Deferred, did self wetprep ?Musculoskeletal:  ?   Right lower leg: No edema.  ?   Left lower leg: No edema.  ?Skin: ?   General: Skin  is warm and dry.  ?   Capillary Refill: Capillary refill takes less than 2 seconds.  ?Neurological:  ?   General: No focal deficit present.  ?   Mental Status: She is alert and oriented to person, place, and time. Mental status is at baseline.  ?Psychiatric:     ?   Mood and Affect: Mood normal.     ?   Behavior: Behavior normal.     ?   Thought Content: Thought content normal.     ?   Judgment: Judgment normal.  ? ? ?Results for orders placed or performed in visit on 06/08/21  ?CULTURE, URINE COMPREHENSIVE  ? Specimen: Urine  ? UR  ?Result Value Ref Range  ? Urine Culture, Comprehensive Final report   ? Organism  ID, Bacteria Comment   ?Microscopic Examination  ? Urine  ?Result Value Ref Range  ? WBC, UA 0-5 0 - 5 /hpf  ? RBC 0-2 0 - 2 /hpf  ? Epithelial Cells (non renal) 0-10 0 - 10 /hpf  ? Renal Epithel, UA None seen None seen /hpf  ? Bacteria, UA Few (A) None seen/Few  ?Urinalysis, Routine w reflex microscopic  ?Result Value Ref Range  ? Specific Gravity, UA 1.020 1.005 - 1.030  ? pH, UA 5.5 5.0 - 7.5  ? Color, UA Yellow Yellow  ? Appearance Ur Clear Clear  ? Leukocytes,UA 1+ (A) Negative  ? Protein,UA Negative Negative/Trace  ? Glucose, UA Negative Negative  ? Ketones, UA Negative Negative  ? RBC, UA 3+ (A) Negative  ? Bilirubin, UA Negative Negative  ? Urobilinogen, Ur 0.2 0.2 - 1.0 mg/dL  ? Nitrite, UA Negative Negative  ? Microscopic Examination See below:   ?ToxASSURE Select 13 (MW), Urine  ?Result Value Ref Range  ? Summary Note   ? ?   ? ?Pertinent labs & imaging results that were available during my care of the patient were reviewed by me and considered in my medical decision making. ? ?Assessment & Plan:  ?Aybree was seen today for vaginitis and cystitis. ? ?Diagnoses and all orders for this visit: ? ?Acute vaginitis ?-     WET PREP FOR TRICH, YEAST, CLUE ?-     Urine cytology ancillary only ?-diflucan sent to pharmacy, symptomatic care discussed in detail.  ?Cystitis ?-     Urinalysis, Complete positive  for leuks, nitrites, trace blood  ?-     Urine Culture ?-     ciprofloxacin (CIPRO) 500 MG tablet; Take 1 tablet (500 mg total) by mouth 2 (two) times daily for 5 days. ?- Encouraged to increase water intak

## 2021-09-28 LAB — URINE CULTURE

## 2021-09-29 LAB — URINE CYTOLOGY ANCILLARY ONLY
Candida Urine: NEGATIVE
Chlamydia: NEGATIVE
Comment: NEGATIVE
Comment: NEGATIVE
Comment: NORMAL
Neisseria Gonorrhea: NEGATIVE
Trichomonas: NEGATIVE

## 2021-11-20 IMAGING — MG MM DIGITAL DIAGNOSTIC UNILAT*L* W/ TOMO W/ CAD
6 series · 6 of 18 positions shown · non-contrast
Comparison: Baseline screening mammogram dated 10/22/2020.

CLINICAL DATA: Patient returns today to evaluate a possible LEFT
breast asymmetry identified on recent baseline screening mammogram.

EXAM:
DIGITAL DIAGNOSTIC UNILATERAL LEFT MAMMOGRAM WITH TOMOSYNTHESIS AND
CAD; ULTRASOUND LEFT BREAST LIMITED
TECHNIQUE: Left digital diagnostic mammography and breast tomosynthesis was
performed. The images were evaluated with computer-aided detection.;
Targeted ultrasound examination of the left breast was performed

[L CC synth-2D (1 of 2)]
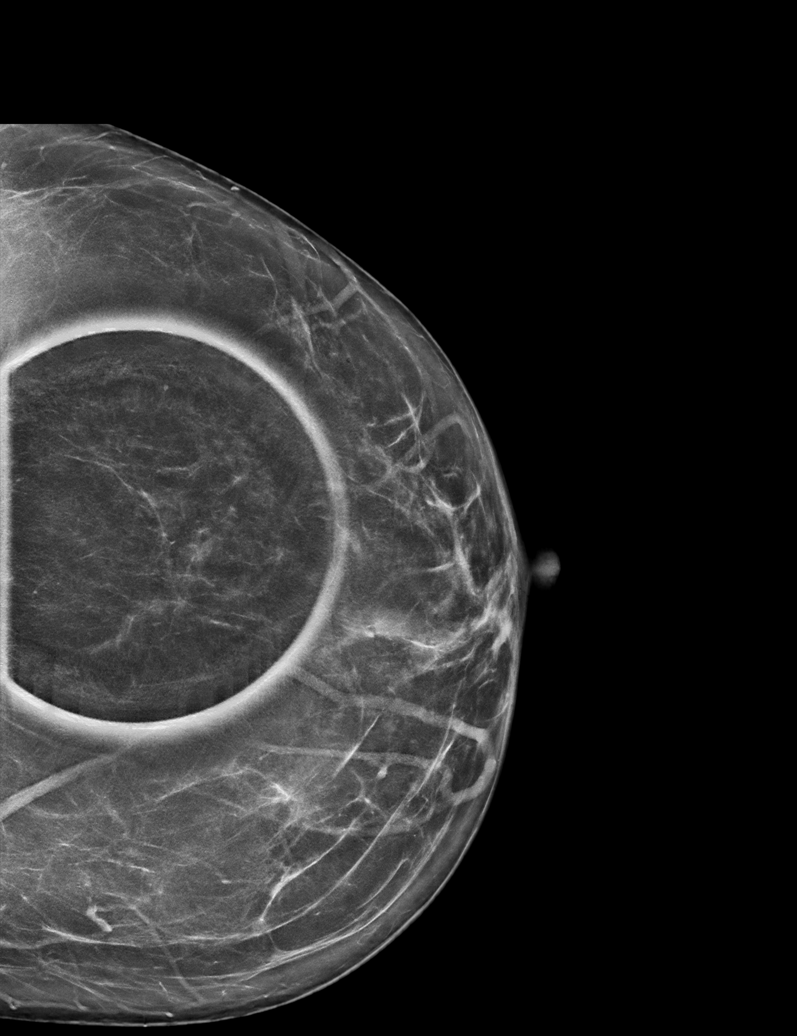

[L ML synth-2D]
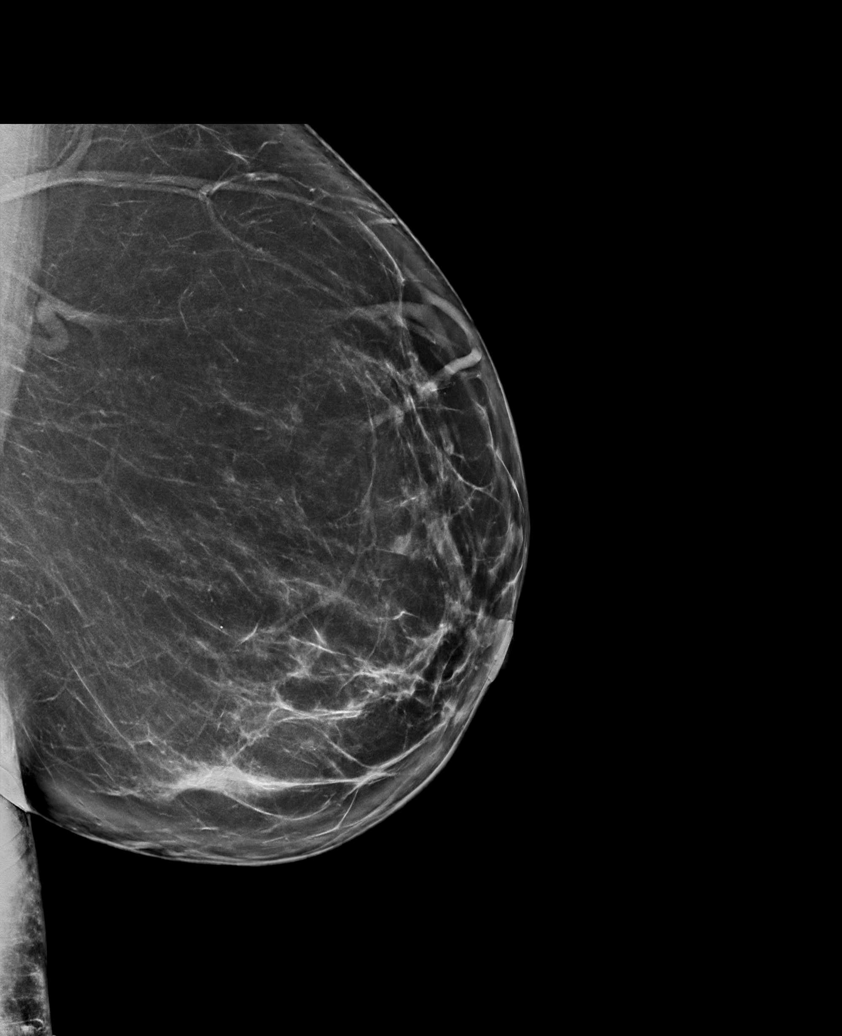

[L CC synth-2D (2 of 2)]
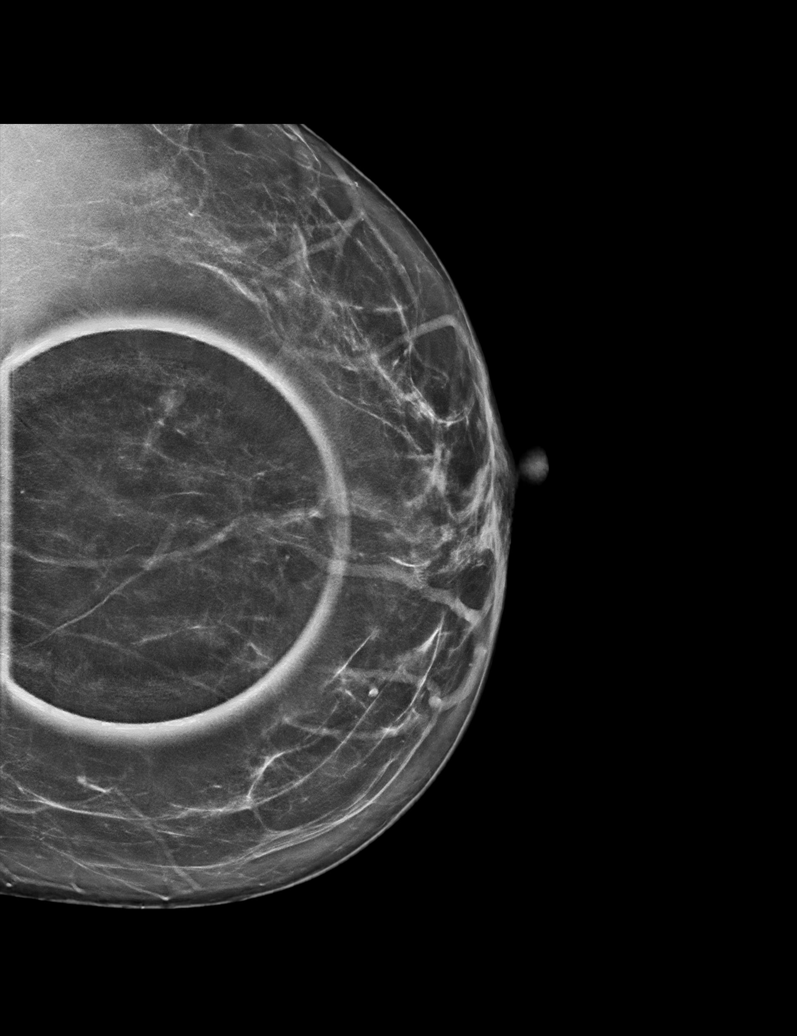

[L ML tomo · tomo slice 45/88.0]
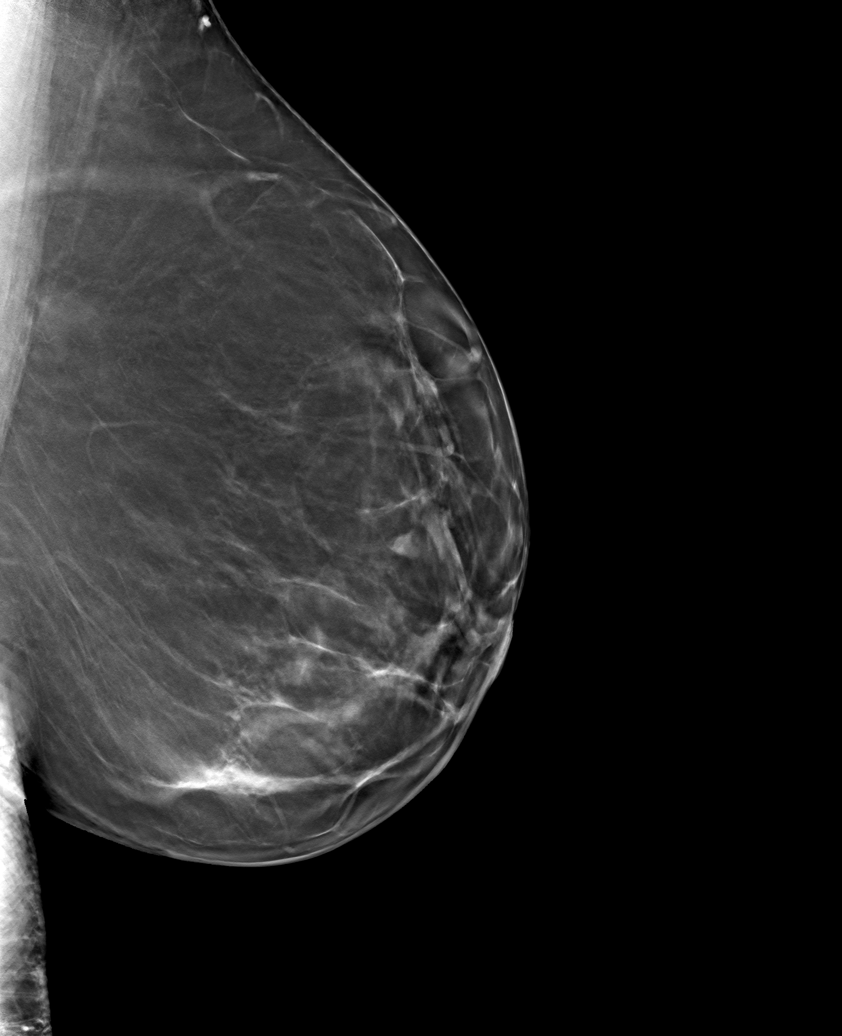

[L CC tomo (1 of 2) · tomo slice 39/76.0]
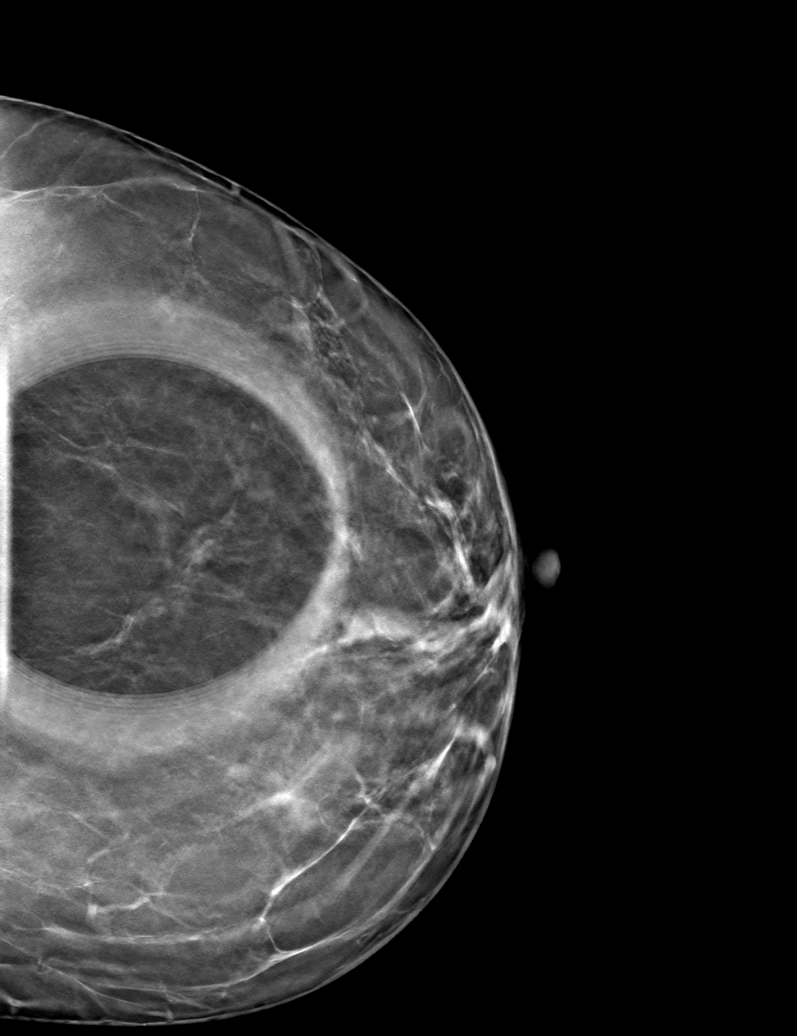

[L CC tomo (2 of 2) · tomo slice 39/78.0]
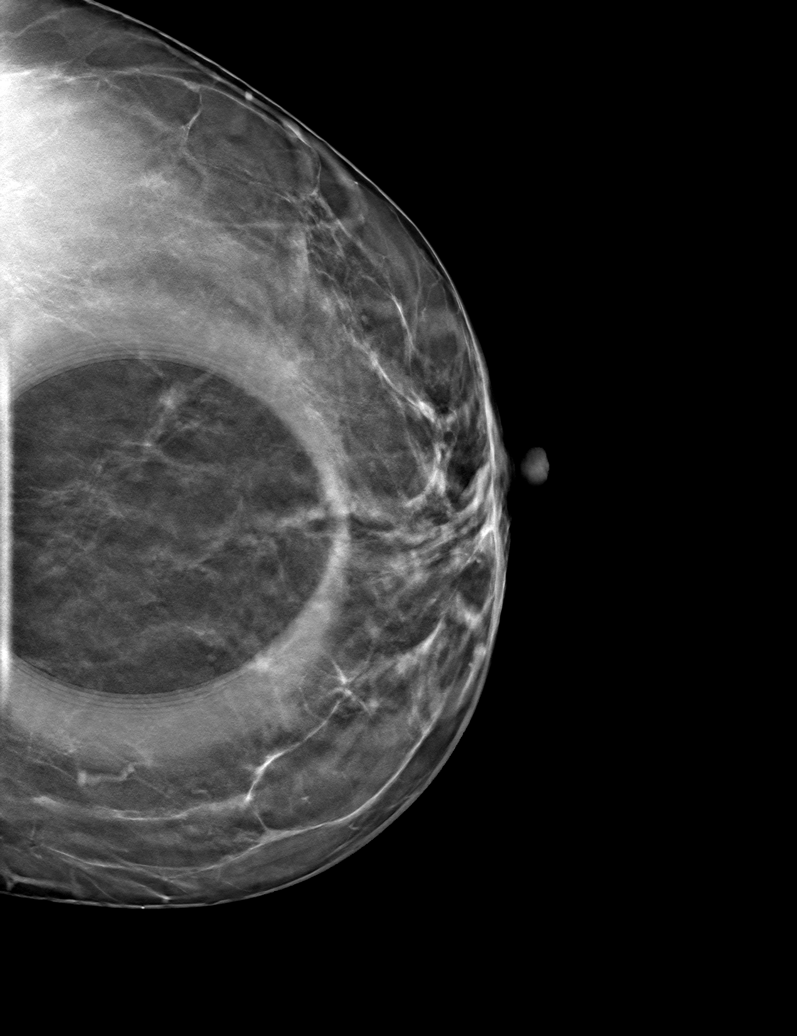

[6 of 18 positions shown; findings below may reference images not displayed]

ACR Breast Density Category b: There are scattered areas of
fibroglandular density.
FINDINGS: LEFT breast diagnostic mammogram: On today's additional diagnostic
views, including spot compression with 3D tomosynthesis, there is a
partially obscured mass confirmed within the upper-outer quadrant,
anterior to middle depth, measuring approximately 5 mm greatest
dimension. Additional partially obscured mass is identified on
today's exam within the lower outer quadrant, also measuring
approximately 5 mm.

Targeted ultrasound is performed, showing benign cysts in the LEFT
breast at the 2 o'clock axis and 4 o'clock axis, measuring 4 mm and
6 mm respectively, corresponding to the mammographic findings. No
suspicious solid or cystic mass is identified by ultrasound
IMPRESSION: No evidence of malignancy. Benign cysts in the LEFT breast at the 2
o'clock and 4 o'clock axes, largest measuring 6 mm, corresponding to
the mammographic findings.

Patient may return to routine annual bilateral screening mammogram
schedule.

RECOMMENDATION:
Screening mammogram in one year.(Code:[id number])

I have discussed the findings and recommendations with the patient.
If applicable, a reminder letter will be sent to the patient
regarding the next appointment.

BI-RADS CATEGORY  2: Benign.

## 2022-02-03 ENCOUNTER — Other Ambulatory Visit: Payer: Self-pay | Admitting: Nurse Practitioner

## 2022-02-03 DIAGNOSIS — Z1231 Encounter for screening mammogram for malignant neoplasm of breast: Secondary | ICD-10-CM

## 2022-02-09 ENCOUNTER — Encounter: Payer: Self-pay | Admitting: Nurse Practitioner

## 2022-02-09 ENCOUNTER — Ambulatory Visit (INDEPENDENT_AMBULATORY_CARE_PROVIDER_SITE_OTHER): Payer: No Typology Code available for payment source | Admitting: Nurse Practitioner

## 2022-02-09 VITALS — BP 110/78 | HR 74 | Temp 98.6°F | Ht 68.0 in | Wt 290.0 lb

## 2022-02-09 DIAGNOSIS — Z0001 Encounter for general adult medical examination with abnormal findings: Secondary | ICD-10-CM | POA: Diagnosis not present

## 2022-02-09 DIAGNOSIS — Z6841 Body Mass Index (BMI) 40.0 and over, adult: Secondary | ICD-10-CM | POA: Diagnosis not present

## 2022-02-09 DIAGNOSIS — J309 Allergic rhinitis, unspecified: Secondary | ICD-10-CM

## 2022-02-09 DIAGNOSIS — K648 Other hemorrhoids: Secondary | ICD-10-CM | POA: Diagnosis not present

## 2022-02-09 DIAGNOSIS — F419 Anxiety disorder, unspecified: Secondary | ICD-10-CM | POA: Diagnosis not present

## 2022-02-09 DIAGNOSIS — Z0000 Encounter for general adult medical examination without abnormal findings: Secondary | ICD-10-CM

## 2022-02-09 MED ORDER — AZELASTINE HCL 0.1 % NA SOLN
1.0000 | Freq: Two times a day (BID) | NASAL | 0 refills | Status: DC
Start: 2022-02-09 — End: 2023-09-30

## 2022-02-09 MED ORDER — FLUTICASONE PROPIONATE 50 MCG/ACT NA SUSP: 2.0000 | Freq: Every day | NASAL | 6 refills | Status: DC

## 2022-02-09 MED ORDER — ALPRAZOLAM 0.5 MG PO TABS: 0.5000 mg | ORAL_TABLET | Freq: Two times a day (BID) | ORAL | 0 refills | Status: DC | PRN

## 2022-02-09 MED ORDER — HYDROCORTISONE ACETATE 25 MG RE SUPP: 25.0000 mg | Freq: Two times a day (BID) | RECTAL | 0 refills | Status: DC

## 2022-02-09 NOTE — Assessment & Plan Note (Signed)
Symptoms have been ongoing for the past few months.  Nothing over-the-counter is helping.  Started patient on Anusol suppository twice daily for 6 days.  Increase hydration, fiber in diet exercise and weight loss are beneficial to help alleviate hemorrhoids.  Continue witch hazel, follow-up with referral to GI.  Patient knows to follow-up with worsening unresolved symptoms.

## 2022-02-09 NOTE — Assessment & Plan Note (Signed)
Anxiety well controlled on Xanax.  Completed GAD-7.  Rx refill sent to pharmacy.  Follow-up in 6 months.

## 2022-02-09 NOTE — Assessment & Plan Note (Signed)
Patient has tried exercise and calorie counting for weight loss with no therapeutic effect in the past few months.  Provided education to patient on continuing to eat a low calorie diet, increasing hydration and exercising.  Patient will benefit from a GLP-1.  Education provided waiting on insurance medication approval.  Patient will follow-up in 4 weeks.

## 2022-02-09 NOTE — Patient Instructions (Addendum)
Here is a guide to help us find out which weight loss medications will be covered by your insurance plan.  Please check out this web site  [employer] and follow the 3 simple steps.   There is also a phone number you can call if you do not have access to the Internet. [phone] (Monday- Friday 8am-8pm)  [employer] provides coverage information for more than 80% of the inquiries submitted!!   Health Maintenance, Female Adopting a healthy lifestyle and getting preventive care are important in promoting health and wellness. Ask your health care provider about: The right schedule for you to have regular tests and exams. Things you can do on your own to prevent diseases and keep yourself healthy. What should I know about diet, weight, and exercise? Eat a healthy diet  Eat a diet that includes plenty of vegetables, fruits, low-fat dairy products, and lean protein. Do not eat a lot of foods that are high in solid fats, added sugars, or sodium. Maintain a healthy weight Body mass index (BMI) is used to identify weight problems. It estimates body fat based on height and weight. Your health care provider can help determine your BMI and help you achieve or maintain a healthy weight. Get regular exercise Get regular exercise. This is one of the most important things you can do for your health. Most adults should: Exercise for at least 150 minutes each week. The exercise should increase your heart rate and make you sweat (moderate-intensity exercise). Do strengthening exercises at least twice a week. This is in addition to the moderate-intensity exercise. Spend less time sitting. Even light physical activity can be beneficial. Watch cholesterol and blood lipids Have your blood tested for lipids and cholesterol at 42 years of age, then have this test every 5 years. Have your cholesterol levels checked more often if: Your lipid or cholesterol levels are high. You are older than 42 years of  age. You are at high risk for heart disease. What should I know about cancer screening? Depending on your health history and family history, you may need to have cancer screening at various ages. This may include screening for: Breast cancer. Cervical cancer. Colorectal cancer. Skin cancer. Lung cancer. What should I know about heart disease, diabetes, and high blood pressure? Blood pressure and heart disease High blood pressure causes heart disease and increases the risk of stroke. This is more likely to develop in people who have high blood pressure readings or are overweight. Have your blood pressure checked: Every 3-5 years if you are 18-39 years of age. Every year if you are 40 years old or older. Diabetes Have regular diabetes screenings. This checks your fasting blood sugar level. Have the screening done: Once every three years after age 40 if you are at a normal weight and have a low risk for diabetes. More often and at a younger age if you are overweight or have a high risk for diabetes. What should I know about preventing infection? Hepatitis B If you have a higher risk for hepatitis B, you should be screened for this virus. Talk with your health care provider to find out if you are at risk for hepatitis B infection. Hepatitis C Testing is recommended for: Everyone born from 1945 through 1965. Anyone with known risk factors for hepatitis C. Sexually transmitted infections (STIs) Get screened for STIs, including gonorrhea and chlamydia, if: You are sexually active and are younger than 42 years of age. You are older than 42 years of   age and your health care provider tells you that you are at risk for this type of infection. Your sexual activity has changed since you were last screened, and you are at increased risk for chlamydia or gonorrhea. Ask your health care provider if you are at risk. Ask your health care provider about whether you are at high risk for HIV. Your health  care provider may recommend a prescription medicine to help prevent HIV infection. If you choose to take medicine to prevent HIV, you should first get tested for HIV. You should then be tested every 3 months for as long as you are taking the medicine. Pregnancy If you are about to stop having your period (premenopausal) and you may become pregnant, seek counseling before you get pregnant. Take 400 to 800 micrograms (mcg) of folic acid every day if you become pregnant. Ask for birth control (contraception) if you want to prevent pregnancy. Osteoporosis and menopause Osteoporosis is a disease in which the bones lose minerals and strength with aging. This can result in bone fractures. If you are 65 years old or older, or if you are at risk for osteoporosis and fractures, ask your health care provider if you should: Be screened for bone loss. Take a calcium or vitamin D supplement to lower your risk of fractures. Be given hormone replacement therapy (HRT) to treat symptoms of menopause. Follow these instructions at home: Alcohol use Do not drink alcohol if: Your health care provider tells you not to drink. You are pregnant, may be pregnant, or are planning to become pregnant. If you drink alcohol: Limit how much you have to: 0-1 drink a day. Know how much alcohol is in your drink. In the [place] one drink equals one 12 oz bottle of beer (355 mL), one 5 oz glass of wine (148 mL), or one 1 oz glass of hard liquor (44 mL). Lifestyle Do not use any products that contain nicotine or tobacco. These products include cigarettes, chewing tobacco, and vaping devices, such as e-cigarettes. If you need help quitting, ask your health care provider. Do not use street drugs. Do not share needles. Ask your health care provider for help if you need support or information about quitting drugs. General instructions Schedule regular health, dental, and eye exams. Stay current with your vaccines. Tell your health  care provider if: You often feel depressed. You have ever been abused or do not feel safe at home. Summary Adopting a healthy lifestyle and getting preventive care are important in promoting health and wellness. Follow your health care provider's instructions about healthy diet, exercising, and getting tested or screened for diseases. Follow your health care provider's instructions on monitoring your cholesterol and blood pressure. This information is not intended to replace advice given to you by your health care provider. Make sure you discuss any questions you have with your health care provider. Document Revised: 11/03/2020 Document Reviewed: 11/03/2020 Elsevier Patient Education  2023 Elsevier Inc.  

## 2022-02-09 NOTE — Assessment & Plan Note (Addendum)
Completed annual physical exam.  Provided education on preventative and health maintenance.  Printed handouts given.  Follow-up in 1 year for annual physical.  Labs completed CBC, CMP, lipid panel.  TSH,

## 2022-02-09 NOTE — Progress Notes (Signed)
Established Patient Office Visit  Subjective   Patient ID: Gloria Carlson, female    DOB: Aug 29, 1980  Age: 41 y.o. MRN: [id number]  Chief Complaint  Patient presents with   Annual Exam    CPE   Hemorrhoids   Obesity    HPI .   Encounter for general adult medical examination Physical: Patient's last physical exam was 1 year ago .  Weight: Appropriate for height (BMI greater than 27%) ;  Blood Pressure: Normal (BP less than 120/80) ;  Medical History: Patient history reviewed ; Family history reviewed ;  Allergies Reviewed: No change in current allergies ;  Medications Reviewed: Medications reviewed - no changes ;  Lipids: Normal lipid levels ; Labs completed results pending Smoking: Life-long non-smoker  Physical Activity: Exercises at least 3 times per week  Alcohol/Drug Use: Is a social-drinker ; No illicit drug use  Patient is not afflicted from Stress Incontinence and Urge Incontinence  Safety: reviewed ; Patient wears a seat belt, has smoke detectors, has carbon monoxide detectors, practices appropriate gun safety, and wears sunscreen with extended sun exposure. Dental Care: annual cleanings, brushes and flosses daily. Ophthalmology/Optometry: Annual visit.  Hearing loss: none Vision impairments: none   Hemorrhoids: Patient complains of evaluation of possible hemorrhoids. Onset of symptoms was gradual starting a few months ago ago with gradually worsening course since that time.  She describes symptoms as anorectal itching and bleeding which only occurs with bowel movements. Treatment to date has been OTC creams: not very effective. Patient denies family hx of colorectal CA.    Obesity: Patient complains of obesity. Patient cites health, increased physical ability as reasons for wanting to lose weight.  Obesity History Weight in late teens: can not remember. Period of greatest weight gain: 290 lb during early adult years Lowest adult weight: can not remember Highest  adult weight: 290 lb Amount of time at present weight: few years lb.   History of Weight Loss Efforts Greatest amount of weight lost: few pounds over few months Amount of time that loss was maintained: few months months Circumstances associated with regain of weight: food choices Successful weight loss techniques attempted: self-directed dieting Unsuccessful weight loss techniques attempted: self-directed dieting  Current Exercise Habits none  Current Eating Habits Number of regular meals per day: 3 Number of snacking episodes per day: 2 Who shops for food? patient Who prepares food? patient Who eats with patient? patient and family Binge behavior?: no Purge behavior? no Anorexic behavior? no Eating precipitated by stress? no Guilt feelings associated with eating? no  Other Potential Contributing Factors Use of alcohol: average few drinks/week Use of medications that may cause weight gain none History of past abuse? none Psych History:  non recorded Comorbidities: none  Patient Active Problem List   Diagnosis Date Noted   Other hemorrhoids 02/09/2022   Severe menstrual cramps 06/08/2021   Pelvic pain 06/08/2021   URI, acute 03/13/2021   Annual physical exam 10/22/2020   Medication management contract signed 06/09/2020   Back pain without sciatica 06/09/2020   BMI 40.0-44.9, adult ([place]) 06/09/2020   Abdominal obesity and metabolic syndrome 34/19/6222   Bee sting 02/04/2017   Anxiety 07/29/2016   Allergic rhinitis 07/29/2016   Herpes zoster without complication 97/98/9211   Past Medical History:  Diagnosis Date   Anxiety    History reviewed. No pertinent surgical history. Social History   Tobacco Use   Smoking status: Former   Smokeless tobacco: Never  Scientific laboratory technician Use:  Never used  Substance Use Topics   Alcohol use: Yes   Drug use: No   Social History   Socioeconomic History   Marital status: Single    Spouse name: Not on file   Number of  children: Not on file   Years of education: Not on file   Highest education level: Not on file  Occupational History   Not on file  Tobacco Use   Smoking status: Former   Smokeless tobacco: Never  Vaping Use   Vaping Use: Never used  Substance and Sexual Activity   Alcohol use: Yes   Drug use: No   Sexual activity: Not on file  Other Topics Concern   Not on file  Social History Narrative   Not on file   Social Determinants of Health   Financial Resource Strain: Not on file  Food Insecurity: Not on file  Transportation Needs: Not on file  Physical Activity: Not on file  Stress: Not on file  Social Connections: Not on file  Intimate Partner Violence: Not on file   Family Status  Relation Name Status   Mother  Alive   Father  Alive   Family History  Problem Relation Age of Onset   Hyperlipidemia Mother    Hypertension Father    Allergies  Allergen Reactions   Macrodantin [Nitrofurantoin Macrocrystal] Itching and Rash      Review of Systems  Constitutional:  Negative for chills, fever, malaise/fatigue and weight loss.  HENT: Negative.    Respiratory: Negative.    Cardiovascular: Negative.   Genitourinary: Negative.   Musculoskeletal: Negative.   Skin: Negative.  Negative for itching and rash.  All other systems reviewed and are negative.     Objective:     BP 110/78   Pulse 74   Temp 98.6 F (37 C)   Ht _0  (1.727 m)   Wt 290 lb (131.5 kg)   SpO2 99%   BMI 44.09 kg/m  BP Readings from Last 3 Encounters:  02/09/22 110/78  09/24/21 106/70  06/08/21 105/72   Wt Readings from Last 3 Encounters:  02/09/22 290 lb (131.5 kg)  09/24/21 283 lb 6.4 oz (128.5 kg)  06/08/21 286 lb (129.7 kg)      Physical Exam Vitals reviewed.  Constitutional:      Appearance: Normal appearance. She is obese.  HENT:     Head: Normocephalic.     Right Ear: External ear normal.     Left Ear: External ear normal.     Nose: Nose normal.     Mouth/Throat:      Mouth: Mucous membranes are moist.     Pharynx: Oropharynx is clear.  Eyes:     Conjunctiva/sclera: Conjunctivae normal.  Cardiovascular:     Rate and Rhythm: Normal rate and regular rhythm.     Pulses: Normal pulses.     Heart sounds: Normal heart sounds.  Pulmonary:     Effort: Pulmonary effort is normal.     Breath sounds: Normal breath sounds.  Abdominal:     General: Bowel sounds are normal.  Skin:    General: Skin is warm.     Findings: No rash.  Neurological:     General: No focal deficit present.     Mental Status: She is alert and oriented to person, place, and time.      No results found for any visits on 02/09/22.  Last CBC Lab Results  Component Value Date   WBC 6.8 03/10/2020  HGB 11.6 03/10/2020   HCT 37.2 03/10/2020   MCV 85 03/10/2020   MCH 26.5 (L) 03/10/2020   RDW 12.8 03/10/2020   PLT 319 54/56/2563   Last metabolic panel Lab Results  Component Value Date   GLUCOSE 84 03/10/2020   NA 139 03/10/2020   K 4.3 03/10/2020   CL 105 03/10/2020   CO2 21 03/10/2020   BUN 12 03/10/2020   CREATININE 1.00 03/10/2020   GFRNONAA 71 03/10/2020   CALCIUM 9.2 03/10/2020   Last lipids Lab Results  Component Value Date   CHOL 181 03/10/2020   HDL 70 03/10/2020   LDLCALC 97 03/10/2020   TRIG 74 03/10/2020   CHOLHDL 2.6 03/10/2020   Last hemoglobin A1c No results found for: "HGBA1C" Last thyroid functions Lab Results  Component Value Date   TSH 1.120 03/10/2020   Last vitamin D No results found for: "25OHVITD2", "25OHVITD3", "VD25OH" Last vitamin B12 and Folate No results found for: "VITAMINB12", "FOLATE"    The 10-year ASCVD risk score (Arnett DK, et al., 2019) is: 0.2%    Assessment & Plan:   Problem List Items Addressed This Visit       Cardiovascular and Mediastinum   Other hemorrhoids    Symptoms have been ongoing for the past few months.  Nothing over-the-counter is helping.  Started patient on Anusol suppository twice daily for 6  days.  Increase hydration, fiber in diet exercise and weight loss are beneficial to help alleviate hemorrhoids.  Continue witch hazel, follow-up with referral to GI.  Patient knows to follow-up with worsening unresolved symptoms.      Relevant Medications   hydrocortisone (ANUSOL-HC) 25 MG suppository   Other Relevant Orders   Ambulatory referral to Gastroenterology     Respiratory   Allergic rhinitis   Relevant Medications   azelastine (ASTELIN) 0.1 % nasal spray   fluticasone (FLONASE) 50 MCG/ACT nasal spray   Other Relevant Orders   TSH     Other   Anxiety    Anxiety well controlled on Xanax.  Completed GAD-7.  Rx refill sent to pharmacy.  Follow-up in 6 months.      Relevant Medications   ALPRAZolam (XANAX) 0.5 MG tablet   Other Relevant Orders   ToxASSURE Select 13 (MW), Urine   BMI 40.0-44.9, adult ([place])    Patient has tried exercise and calorie counting for weight loss with no therapeutic effect in the past few months.  Provided education to patient on continuing to eat a low calorie diet, increasing hydration and exercising.  Patient will benefit from a GLP-1.  Education provided waiting on insurance medication approval.  Patient will follow-up in 4 weeks.      Annual physical exam - Primary    Completed annual physical exam.  Provided education on preventative and health maintenance.  Printed handouts given.  Follow-up in 1 year for annual physical.  Labs completed CBC, CMP, lipid panel.  TSH,      Relevant Orders   HIV Antibody (routine testing w rflx)   Hepatitis C Antibody   CBC with Differential/Platelet   CMP14+EGFR   Lipid panel   VITAMIN D 25 Hydroxy (Vit-D Deficiency, Fractures)    Return in about 1 year (around 02/10/2023) for annual physical.    Ivy Lynn, NP

## 2022-02-11 ENCOUNTER — Other Ambulatory Visit: Payer: No Typology Code available for payment source

## 2022-02-11 ENCOUNTER — Encounter: Payer: Self-pay | Admitting: *Deleted

## 2022-02-11 DIAGNOSIS — J309 Allergic rhinitis, unspecified: Secondary | ICD-10-CM

## 2022-02-11 DIAGNOSIS — Z0000 Encounter for general adult medical examination without abnormal findings: Secondary | ICD-10-CM

## 2022-02-11 DIAGNOSIS — F419 Anxiety disorder, unspecified: Secondary | ICD-10-CM

## 2022-02-12 ENCOUNTER — Ambulatory Visit
Admission: RE | Admit: 2022-02-12 | Discharge: 2022-02-12 | Disposition: A | Discharge status: Home / Self Care | Payer: No Typology Code available for payment source | Source: Ambulatory Visit | Attending: Nurse Practitioner | Admitting: Nurse Practitioner

## 2022-02-12 ENCOUNTER — Other Ambulatory Visit: Payer: Self-pay | Admitting: Nurse Practitioner

## 2022-02-12 DIAGNOSIS — E559 Vitamin D deficiency, unspecified: Secondary | ICD-10-CM

## 2022-02-12 DIAGNOSIS — Z1231 Encounter for screening mammogram for malignant neoplasm of breast: Secondary | ICD-10-CM

## 2022-02-12 LAB — CBC WITH DIFFERENTIAL/PLATELET
Basophils Absolute: 0.1 10*3/uL (ref 0.0–0.2)
Basos: 1 %
EOS (ABSOLUTE): 1 10*3/uL — ABNORMAL HIGH (ref 0.0–0.4)
Eos: 15 %
Hematocrit: 34.8 % (ref 34.0–46.6)
Hemoglobin: 11.5 g/dL (ref 11.1–15.9)
Immature Grans (Abs): 0 10*3/uL (ref 0.0–0.1)
Immature Granulocytes: 0 %
Lymphocytes Absolute: 1.6 10*3/uL (ref 0.7–3.1)
Lymphs: 24 %
MCH: 27.6 pg (ref 26.6–33.0)
MCHC: 33 g/dL (ref 31.5–35.7)
MCV: 84 fL (ref 79–97)
Monocytes Absolute: 0.5 10*3/uL (ref 0.1–0.9)
Monocytes: 8 %
Neutrophils Absolute: 3.3 10*3/uL (ref 1.4–7.0)
Neutrophils: 52 %
Platelets: 313 10*3/uL (ref 150–450)
RBC: 4.16 x10E6/uL (ref 3.77–5.28)
RDW: 12.6 % (ref 11.7–15.4)
WBC: 6.5 10*3/uL (ref 3.4–10.8)

## 2022-02-12 LAB — CMP14+EGFR
ALT: 11 IU/L (ref 0–32)
AST: 15 IU/L (ref 0–40)
Albumin/Globulin Ratio: 1.7 (ref 1.2–2.2)
Albumin: 4 g/dL (ref 3.9–4.9)
Alkaline Phosphatase: 79 IU/L (ref 44–121)
BUN/Creatinine Ratio: 12 (ref 9–23)
BUN: 11 mg/dL (ref 6–24)
Bilirubin Total: 0.2 mg/dL (ref 0.0–1.2)
CO2: 20 mmol/L (ref 20–29)
Calcium: 8.7 mg/dL (ref 8.7–10.2)
Chloride: 104 mmol/L (ref 96–106)
Creatinine, Ser: 0.91 mg/dL (ref 0.57–1.00)
Globulin, Total: 2.3 g/dL (ref 1.5–4.5)
Glucose: 83 mg/dL (ref 70–99)
Potassium: 4.1 mmol/L (ref 3.5–5.2)
Sodium: 139 mmol/L (ref 134–144)
Total Protein: 6.3 g/dL (ref 6.0–8.5)
eGFR: 81 mL/min/{1.73_m2} (ref >59)

## 2022-02-12 LAB — TSH: TSH: 1.27 u[IU]/mL (ref 0.450–4.500)

## 2022-02-12 LAB — HIV ANTIBODY (ROUTINE TESTING W REFLEX): HIV Screen 4th Generation wRfx: NONREACTIVE

## 2022-02-12 LAB — LIPID PANEL
Chol/HDL Ratio: 2.8 ratio (ref 0.0–4.4)
Cholesterol, Total: 185 mg/dL (ref 100–199)
HDL: 66 mg/dL (ref >39)
LDL Chol Calc (NIH): 109 mg/dL — ABNORMAL HIGH (ref 0–99)
Triglycerides: 54 mg/dL (ref 0–149)
VLDL Cholesterol Cal: 10 mg/dL (ref 5–40)

## 2022-02-12 LAB — HEPATITIS C ANTIBODY: Hep C Virus Ab: NONREACTIVE

## 2022-02-12 LAB — VITAMIN D 25 HYDROXY (VIT D DEFICIENCY, FRACTURES): Vit D, 25-Hydroxy: 21.4 ng/mL — ABNORMAL LOW (ref 30.0–100.0)

## 2022-02-12 MED ORDER — VITAMIN D (ERGOCALCIFEROL) 1.25 MG (50000 UNIT) PO CAPS: 50000.0000 [IU] | ORAL_CAPSULE | ORAL | 0 refills | Status: AC

## 2022-02-15 NOTE — Progress Notes (Signed)
Patient has been notified by the imaging center

## 2022-02-16 LAB — TOXASSURE SELECT 13 (MW), URINE

## 2022-03-04 ENCOUNTER — Encounter: Payer: Self-pay | Admitting: *Deleted

## 2022-03-04 ENCOUNTER — Telehealth: Payer: Self-pay | Admitting: *Deleted

## 2022-03-04 ENCOUNTER — Encounter: Payer: Self-pay | Admitting: Gastroenterology

## 2022-03-04 ENCOUNTER — Ambulatory Visit (INDEPENDENT_AMBULATORY_CARE_PROVIDER_SITE_OTHER): Payer: No Typology Code available for payment source | Admitting: Gastroenterology

## 2022-03-04 VITALS — BP 107/75 | HR 74 | Temp 97.1°F | Ht 68.0 in | Wt 283.6 lb

## 2022-03-04 DIAGNOSIS — K219 Gastro-esophageal reflux disease without esophagitis: Secondary | ICD-10-CM

## 2022-03-04 DIAGNOSIS — K648 Other hemorrhoids: Secondary | ICD-10-CM | POA: Diagnosis not present

## 2022-03-04 DIAGNOSIS — K625 Hemorrhage of anus and rectum: Secondary | ICD-10-CM

## 2022-03-04 DIAGNOSIS — R1310 Dysphagia, unspecified: Secondary | ICD-10-CM

## 2022-03-04 DIAGNOSIS — Z800 Family history of malignant neoplasm of digestive organs: Secondary | ICD-10-CM

## 2022-03-04 DIAGNOSIS — K5900 Constipation, unspecified: Secondary | ICD-10-CM

## 2022-03-04 MED ORDER — PANTOPRAZOLE SODIUM 40 MG PO TBEC: 40.0000 mg | DELAYED_RELEASE_TABLET | Freq: Every day | ORAL | 3 refills | Status: DC

## 2022-03-04 MED ORDER — PEG 3350-KCL-NA BICARB-NACL 420 G PO SOLR: 4000.0000 mL | Freq: Once | ORAL | 0 refills | Status: AC

## 2022-03-04 NOTE — Patient Instructions (Signed)
We are arranging a colonoscopy in the near future with Dr. Abbey Chatters. I have added a possible endoscopy with dilation if you continue to have that sensation of food sticking.   I have sent in pantoprazole to take once each morning, 30 minutes before breakfast for reflux. I suspect this is going to help with your symptoms.  For constipation: take Benefiber 2 teaspoons each morning in beverage of your choice. Drink plenty of water. You can take Miralax as needed.   For hemorrhoid care: avoid straining, limit toilet time to 2-3 minutes, avoid constipation.  I will see you in follow-up for hemorrhoid banding!  It was a pleasure to see you today. I want to create trusting relationships with patients to provide genuine, compassionate, and quality care. I value your feedback. If you receive a survey regarding your visit,  I greatly appreciate you taking time to fill this out.   Annitta Needs, PhD, ANP-BC [facility]

## 2022-03-04 NOTE — Progress Notes (Addendum)
Gastroenterology Office Note    Referring Provider: Ivy Lynn, NP Primary Care Physician:  Ivy Lynn, NP  Primary GI: Dr. Abbey Chatters    Chief Complaint   Chief Complaint  Patient presents with   Hemorrhoids    States that they do bleed at times, mom urged her to have them looked at due to her mom having cancerous polyps removed.    Gastroesophageal Reflux    Has some issues with reflux, feels like food gets stuck in middle of chest.    Constipation    Has some issues with constipation.      History of Present Illness   Gloria Carlson is a pleasant 41 y.o. female presenting today at the request of Ijaola, Onyeje M, NP due to rectal bleeding, constipation, GERD, and family history of colon cancer with need for colonoscopy. She notes her mother was diagnosed with colon cancer in her 35s but doing well.    Notes 6 months-1 year of rectal bleeding and itching. No rectal pain. Some pressure. No prolapsing.  Intermittent constipation. BM not daily. Tries not to strain. Toilet time 10-15 minutes. Will take Miralax as needed. Trying to increase fiber more.   GERD: no PPI. Reflux happens if not enough water. Triggers including spicy foods. Feels food getting stuck in chest a few times. Has to stand up to let it move down. Will take Tums as needed.   No unexplained weight loss or lack of appetite.     Past Medical History:  Diagnosis Date   Allergies    Anxiety    Asthma    as child    Past Surgical History:  Procedure Laterality Date   None      Current Outpatient Medications  Medication Sig Dispense Refill   ALPRAZolam (XANAX) 0.5 MG tablet Take 1 tablet (0.5 mg total) by mouth 2 (two) times daily as needed for anxiety. 60 tablet 0   azelastine (ASTELIN) 0.1 % nasal spray Place 1 spray into both nostrils 2 (two) times daily. Use in each nostril as directed 30 mL 0   cetirizine (ZYRTEC) 10 MG tablet Take 10 mg by mouth daily.     fluticasone (FLONASE) 50  MCG/ACT nasal spray Place 2 sprays into both nostrils daily. 16 g 6   paragard intrauterine copper IUD IUD by Intrauterine route.     Vitamin D, Ergocalciferol, (DRISDOL) 1.25 MG (50000 UNIT) CAPS capsule Take 1 capsule (50,000 Units total) by mouth every 7 (seven) days. 7 capsule 0   No current facility-administered medications for this visit.    Allergies as of 03/04/2022 - Review Complete 03/04/2022  Allergen Reaction Noted   Macrodantin [nitrofurantoin macrocrystal] Itching and Rash 07/26/2016    Family History  Problem Relation Age of Onset   Hyperlipidemia Mother    Hypertension Father     Social History   Socioeconomic History   Marital status: Single    Spouse name: Not on file   Number of children: Not on file   Years of education: Not on file   Highest education level: Not on file  Occupational History   Occupation: [employer]  Tobacco Use   Smoking status: Former   Smokeless tobacco: Never  Scientific laboratory technician Use: Never used  Substance and Sexual Activity   Alcohol use: Yes    Comment: occ   Drug use: No   Sexual activity: Yes  Other Topics Concern   Not on file  Social  History Narrative   Not on file   Social Determinants of Health   Financial Resource Strain: Not on file  Food Insecurity: Not on file  Transportation Needs: Not on file  Physical Activity: Not on file  Stress: Not on file  Social Connections: Not on file  Intimate Partner Violence: Not on file     Review of Systems   Gen: Denies any fever, chills, fatigue, weight loss, lack of appetite.  CV: Denies chest pain, heart palpitations, peripheral edema, syncope.  Resp: Denies shortness of breath at rest or with exertion. Denies wheezing or cough.  GI: see HPI GU : Denies urinary burning, urinary frequency, urinary hesitancy MS: Denies joint pain, muscle weakness, cramps, or limitation of movement.  Derm: Denies rash, itching, dry skin Psych: Denies depression,  anxiety, memory loss, and confusion Heme: Denies bruising, bleeding, and enlarged lymph nodes.   Physical Exam   BP 107/75 (BP Location: Right Arm, Patient Position: Sitting, Cuff Size: Large)   Pulse 74   Temp (!) 97.1 F (36.2 C) (Temporal)   Ht '5\' 8"'$  (1.727 m)   Wt 283 lb 9.6 oz (128.6 kg)   LMP  (LMP Unknown)   SpO2 98%   BMI 43.12 kg/m  General:   Alert and oriented. Pleasant and cooperative. Well-nourished and well-developed.  Head:  Normocephalic and atraumatic. Eyes:  Without icterus Ears:  Normal auditory acuity. Lungs:  Clear to auscultation bilaterally.  Heart:  S1, S2 present without murmurs appreciated.  Abdomen:  +BS, soft, non-tender and non-distended. No HSM noted. No guarding or rebound. No masses appreciated.  Rectal:  small hemorrhoidal skin tag, DRE without mass or discomfort. Prominence of left lateral column internally  Msk:  Symmetrical without gross deformities. Normal posture. Extremities:  Without edema. Neurologic:  Alert and  oriented x4;  grossly normal neurologically. Skin:  Intact without significant lesions or rashes. Psych:  Alert and cooperative. Normal mood and affect.   Assessment   Gloria Carlson is a 41 y.o. female presenting today as a new patient in consultation at the request of Ijaola, Onyeje M, NP due to rectal bleeding, constipation, GERD, and family history of colon cancer with need for colonoscopy.  Rectal bleeding: most consistent with internal hemorrhoids. DRE completed today. Will pursue diagnostic colonoscopy in near future. Notably, she does have family history of colon cancer in mother in her 33s. I discussed hemorrhoid banding with her today, which we will pursue after colonoscopy.   Constipation: mild. Start Benefiber. Continue Miralax as needed.   GERD: with occasional solid food dysphagia. No PPI. Start pantoprazole. No other alarm signs/symptoms. We have added possible EGD/dilation at time of colonoscopy. She has requested  to hold off on this IF symptoms resolved by time of procedure with PPI.     PLAN   Proceed with colonoscopy +/- EGD/dilation by Dr. Abbey Chatters  in near future: the risks, benefits, and alternatives have been discussed with the patient in detail. The patient states understanding and desires to proceed. ASA 3 due to BMI  Start pantoprazole daily  Benefiber daily, miralax prn  Avoid straining, limit toilet time  Return in follow-up for hemorrhoid banding    Annitta Needs, PhD, ANP-BC [facility]   ADDENDUM on 9/18: Patient declining EGD, only wants to pursue colonoscopy. Annitta Needs, PhD, ANP-BC [facility]

## 2022-03-04 NOTE — Telephone Encounter (Signed)
PA: Notification or Prior Authorization is not required for the requested services  You are not required to submit a notification/prior authorization based on the information provided. The number above acknowledges your inquiry and our response. Please write this number down and refer to it for future inquiries. If you still wish to submit your request for review, please select the Continue with Submission button below.  Decision ID #:[id number]

## 2022-03-04 NOTE — H&P (View-Only) (Signed)
Gastroenterology Office Note    Referring Provider: Ivy Lynn, NP Primary Care Physician:  Ivy Lynn, NP  Primary GI: Dr. Abbey Chatters    Chief Complaint   Chief Complaint  Patient presents with   Hemorrhoids    States that they do bleed at times, mom urged her to have them looked at due to her mom having cancerous polyps removed.    Gastroesophageal Reflux    Has some issues with reflux, feels like food gets stuck in middle of chest.    Constipation    Has some issues with constipation.      History of Present Illness   Gloria Carlson is a pleasant 41 y.o. female presenting today at the request of Ijaola, Onyeje M, NP due to rectal bleeding, constipation, GERD, and family history of colon cancer with need for colonoscopy. She notes her mother was diagnosed with colon cancer in her 45s but doing well.    Notes 6 months-1 year of rectal bleeding and itching. No rectal pain. Some pressure. No prolapsing.  Intermittent constipation. BM not daily. Tries not to strain. Toilet time 10-15 minutes. Will take Miralax as needed. Trying to increase fiber more.   GERD: no PPI. Reflux happens if not enough water. Triggers including spicy foods. Feels food getting stuck in chest a few times. Has to stand up to let it move down. Will take Tums as needed.   No unexplained weight loss or lack of appetite.     Past Medical History:  Diagnosis Date   Allergies    Anxiety    Asthma    as child    Past Surgical History:  Procedure Laterality Date   None      Current Outpatient Medications  Medication Sig Dispense Refill   ALPRAZolam (XANAX) 0.5 MG tablet Take 1 tablet (0.5 mg total) by mouth 2 (two) times daily as needed for anxiety. 60 tablet 0   azelastine (ASTELIN) 0.1 % nasal spray Place 1 spray into both nostrils 2 (two) times daily. Use in each nostril as directed 30 mL 0   cetirizine (ZYRTEC) 10 MG tablet Take 10 mg by mouth daily.     fluticasone (FLONASE) 50  MCG/ACT nasal spray Place 2 sprays into both nostrils daily. 16 g 6   paragard intrauterine copper IUD IUD by Intrauterine route.     Vitamin D, Ergocalciferol, (DRISDOL) 1.25 MG (50000 UNIT) CAPS capsule Take 1 capsule (50,000 Units total) by mouth every 7 (seven) days. 7 capsule 0   No current facility-administered medications for this visit.    Allergies as of 03/04/2022 - Review Complete 03/04/2022  Allergen Reaction Noted   Macrodantin [nitrofurantoin macrocrystal] Itching and Rash 07/26/2016    Family History  Problem Relation Age of Onset   Hyperlipidemia Mother    Hypertension Father     Social History   Socioeconomic History   Marital status: Single    Spouse name: Not on file   Number of children: Not on file   Years of education: Not on file   Highest education level: Not on file  Occupational History   Occupation: [employer]  Tobacco Use   Smoking status: Former   Smokeless tobacco: Never  Scientific laboratory technician Use: Never used  Substance and Sexual Activity   Alcohol use: Yes    Comment: occ   Drug use: No   Sexual activity: Yes  Other Topics Concern   Not on file  Social  History Narrative   Not on file   Social Determinants of Health   Financial Resource Strain: Not on file  Food Insecurity: Not on file  Transportation Needs: Not on file  Physical Activity: Not on file  Stress: Not on file  Social Connections: Not on file  Intimate Partner Violence: Not on file     Review of Systems   Gen: Denies any fever, chills, fatigue, weight loss, lack of appetite.  CV: Denies chest pain, heart palpitations, peripheral edema, syncope.  Resp: Denies shortness of breath at rest or with exertion. Denies wheezing or cough.  GI: see HPI GU : Denies urinary burning, urinary frequency, urinary hesitancy MS: Denies joint pain, muscle weakness, cramps, or limitation of movement.  Derm: Denies rash, itching, dry skin Psych: Denies depression,  anxiety, memory loss, and confusion Heme: Denies bruising, bleeding, and enlarged lymph nodes.   Physical Exam   BP 107/75 (BP Location: Right Arm, Patient Position: Sitting, Cuff Size: Large)   Pulse 74   Temp (!) 97.1 F (36.2 C) (Temporal)   Ht '5\' 8"'$  (1.727 m)   Wt 283 lb 9.6 oz (128.6 kg)   LMP  (LMP Unknown)   SpO2 98%   BMI 43.12 kg/m  General:   Alert and oriented. Pleasant and cooperative. Well-nourished and well-developed.  Head:  Normocephalic and atraumatic. Eyes:  Without icterus Ears:  Normal auditory acuity. Lungs:  Clear to auscultation bilaterally.  Heart:  S1, S2 present without murmurs appreciated.  Abdomen:  +BS, soft, non-tender and non-distended. No HSM noted. No guarding or rebound. No masses appreciated.  Rectal:  small hemorrhoidal skin tag, DRE without mass or discomfort. Prominence of left lateral column internally  Msk:  Symmetrical without gross deformities. Normal posture. Extremities:  Without edema. Neurologic:  Alert and  oriented x4;  grossly normal neurologically. Skin:  Intact without significant lesions or rashes. Psych:  Alert and cooperative. Normal mood and affect.   Assessment   Gloria Carlson is a 41 y.o. female presenting today as a new patient in consultation at the request of Ijaola, Onyeje M, NP due to rectal bleeding, constipation, GERD, and family history of colon cancer with need for colonoscopy.  Rectal bleeding: most consistent with internal hemorrhoids. DRE completed today. Will pursue diagnostic colonoscopy in near future. Notably, she does have family history of colon cancer in mother in her 74s. I discussed hemorrhoid banding with her today, which we will pursue after colonoscopy.   Constipation: mild. Start Benefiber. Continue Miralax as needed.   GERD: with occasional solid food dysphagia. No PPI. Start pantoprazole. No other alarm signs/symptoms. We have added possible EGD/dilation at time of colonoscopy. She has requested  to hold off on this IF symptoms resolved by time of procedure with PPI.     PLAN   Proceed with colonoscopy +/- EGD/dilation by Dr. Abbey Chatters  in near future: the risks, benefits, and alternatives have been discussed with the patient in detail. The patient states understanding and desires to proceed. ASA 3 due to BMI  Start pantoprazole daily  Benefiber daily, miralax prn  Avoid straining, limit toilet time  Return in follow-up for hemorrhoid banding    Annitta Needs, PhD, ANP-BC [facility]   ADDENDUM on 9/18: Patient declining EGD, only wants to pursue colonoscopy. Annitta Needs, PhD, ANP-BC [facility]

## 2022-03-09 ENCOUNTER — Encounter: Payer: Self-pay | Admitting: Nurse Practitioner

## 2022-03-10 ENCOUNTER — Other Ambulatory Visit: Payer: Self-pay | Admitting: Nurse Practitioner

## 2022-03-10 DIAGNOSIS — M5489 Other dorsalgia: Secondary | ICD-10-CM

## 2022-03-10 MED ORDER — CYCLOBENZAPRINE HCL 5 MG PO TABS: 5.0000 mg | ORAL_TABLET | Freq: Three times a day (TID) | ORAL | 1 refills | Status: DC | PRN

## 2022-03-10 MED ORDER — NAPROXEN 500 MG PO TABS: 500.0000 mg | ORAL_TABLET | Freq: Two times a day (BID) | ORAL | 0 refills | Status: DC

## 2022-03-10 NOTE — Telephone Encounter (Signed)
Medication last prescribed 1 year ago, I refilled medication to pharmacy this time but , Back pain will need to be evaluated next time for this symptoms and referral and imaging will be needed if symptoms are not well managed.

## 2022-03-15 ENCOUNTER — Telehealth: Payer: Self-pay | Admitting: *Deleted

## 2022-03-15 NOTE — Telephone Encounter (Signed)
Noted  

## 2022-03-15 NOTE — Telephone Encounter (Signed)
Patient states that she only wants to do the colonoscopy at this time and doesn't want to proceed with the EGD. FYI

## 2022-03-16 NOTE — Patient Instructions (Signed)
Gloria Carlson  03/16/2022     '@PREFPERIOPPHARMACY'$ @   Your procedure is scheduled on  03/22/2022.   Report to [facility] at  1130  A.M.   Call this number if you have problems the morning of surgery:  [phone]   Remember:  Follow the diet and prep instructions given to you by the office.     Take these medicines the morning of surgery with A SIP OF WATER                    xanax(if needed), zyrtec, flexeril(if needed), protonix.     Do not wear jewelry, make-up or nail polish.  Do not wear lotions, powders, or perfumes, or deodorant.  Do not shave 48 hours prior to surgery.  Men may shave face and neck.  Do not bring valuables to the hospital.  [facility] is not responsible for any belongings or valuables.  Contacts, dentures or bridgework may not be worn into surgery.  Leave your suitcase in the car.  After surgery it may be brought to your room.  For patients admitted to the hospital, discharge time will be determined by your treatment team.  Patients discharged the day of surgery will not be allowed to drive home and must have someone with them for 24 hours.    Special instructions:   DO NOT smoke tobacco or vape for 24 hours before your procedure.  Please read over the following fact sheets that you were given. Anesthesia Post-op Instructions and Care and Recovery After Surgery      Colonoscopy, Adult, Care After The following information offers guidance on how to care for yourself after your procedure. Your health care provider may also give you more specific instructions. If you have problems or questions, contact your health care provider. What can I expect after the procedure? After the procedure, it is common to have: A small amount of blood in your stool for 24 hours after the procedure. Some gas. Mild cramping or bloating of your abdomen. Follow these instructions at home: Eating and drinking  Drink enough fluid to keep your urine pale  yellow. Follow instructions from your health care provider about eating or drinking restrictions. Resume your normal diet as told by your health care provider. Avoid heavy or fried foods that are hard to digest. Activity Rest as told by your health care provider. Avoid sitting for a long time without moving. Get up to take short walks every 1-2 hours. This is important to improve blood flow and breathing. Ask for help if you feel weak or unsteady. Return to your normal activities as told by your health care provider. Ask your health care provider what activities are safe for you. Managing cramping and bloating  Try walking around when you have cramps or feel bloated. If directed, apply heat to your abdomen as told by your health care provider. Use the heat source that your health care provider recommends, such as a moist heat pack or a heating pad. Place a towel between your skin and the heat source. Leave the heat on for 20-30 minutes. Remove the heat if your skin turns bright red. This is especially important if you are unable to feel pain, heat, or cold. You have a greater risk of getting burned. General instructions If you were given a sedative during the procedure, it can affect you for several hours. Do not drive or operate machinery until your health care provider says  that it is safe. For the first 24 hours after the procedure: Do not sign important documents. Do not drink alcohol. Do your regular daily activities at a slower pace than normal. Eat soft foods that are easy to digest. Take over-the-counter and prescription medicines only as told by your health care provider. Keep all follow-up visits. This is important. Contact a health care provider if: You have blood in your stool 2-3 days after the procedure. Get help right away if: You have more than a small spotting of blood in your stool. You have large blood clots in your stool. You have swelling of your abdomen. You have  nausea or vomiting. You have a fever. You have increasing pain in your abdomen that is not relieved with medicine. These symptoms may be an emergency. Get help right away. Call 911. Do not wait to see if the symptoms will go away. Do not drive yourself to the hospital. Summary After the procedure, it is common to have a small amount of blood in your stool. You may also have mild cramping and bloating of your abdomen. If you were given a sedative during the procedure, it can affect you for several hours. Do not drive or operate machinery until your health care provider says that it is safe. Get help right away if you have a lot of blood in your stool, nausea or vomiting, a fever, or increased pain in your abdomen. This information is not intended to replace advice given to you by your health care provider. Make sure you discuss any questions you have with your health care provider. Document Revised: 02/04/2021 Document Reviewed: 02/04/2021 Elsevier Patient Education  [place] After This sheet gives you information about how to care for yourself after your procedure. Your health care provider may also give you more specific instructions. If you have problems or questions, contact your health care provider. What can I expect after the procedure? After the procedure, it is common to have: Tiredness. Forgetfulness about what happened after the procedure. Impaired judgment for important decisions. Nausea or vomiting. Some difficulty with balance. Follow these instructions at home: For the time period you were told by your health care provider:     Rest as needed. Do not participate in activities where you could fall or become injured. Do not drive or use machinery. Do not drink alcohol. Do not take sleeping pills or medicines that cause drowsiness. Do not make important decisions or sign legal documents. Do not take care of children on your  own. Eating and drinking Follow the diet that is recommended by your health care provider. Drink enough fluid to keep your urine pale yellow. If you vomit: Drink water, juice, or soup when you can drink without vomiting. Make sure you have little or no nausea before eating solid foods. General instructions Have a responsible adult stay with you for the time you are told. It is important to have someone help care for you until you are awake and alert. Take over-the-counter and prescription medicines only as told by your health care provider. If you have sleep apnea, surgery and certain medicines can increase your risk for breathing problems. Follow instructions from your health care provider about wearing your sleep device: Anytime you are sleeping, including during daytime naps. While taking prescription pain medicines, sleeping medicines, or medicines that make you drowsy. Avoid smoking. Keep all follow-up visits as told by your health care provider. This is important. Contact a health  care provider if: You keep feeling nauseous or you keep vomiting. You feel light-headed. You are still sleepy or having trouble with balance after 24 hours. You develop a rash. You have a fever. You have redness or swelling around the IV site. Get help right away if: You have trouble breathing. You have new-onset confusion at home. Summary For several hours after your procedure, you may feel tired. You may also be forgetful and have poor judgment. Have a responsible adult stay with you for the time you are told. It is important to have someone help care for you until you are awake and alert. Rest as told. Do not drive or operate machinery. Do not drink alcohol or take sleeping pills. Get help right away if you have trouble breathing, or if you suddenly become confused. This information is not intended to replace advice given to you by your health care provider. Make sure you discuss any questions you  have with your health care provider. Document Revised: 05/19/2021 Document Reviewed: 05/17/2019 Elsevier Patient Education  [place].

## 2022-03-17 ENCOUNTER — Encounter (HOSPITAL_COMMUNITY): Payer: No Typology Code available for payment source

## 2022-03-18 ENCOUNTER — Encounter: Payer: No Typology Code available for payment source | Admitting: Gastroenterology

## 2022-03-18 ENCOUNTER — Encounter (HOSPITAL_COMMUNITY)
Admission: RE | Admit: 2022-03-18 | Discharge: 2022-03-18 | Disposition: A | Discharge status: Home / Self Care | Payer: No Typology Code available for payment source | Source: Ambulatory Visit | Attending: Internal Medicine | Admitting: Internal Medicine

## 2022-03-18 VITALS — BP 114/91 | HR 80 | Temp 97.1°F | Resp 18 | Ht 68.0 in | Wt 283.6 lb

## 2022-03-18 DIAGNOSIS — Z01812 Encounter for preprocedural laboratory examination: Secondary | ICD-10-CM | POA: Diagnosis present

## 2022-03-18 DIAGNOSIS — Z01818 Encounter for other preprocedural examination: Secondary | ICD-10-CM

## 2022-03-18 LAB — POCT PREGNANCY, URINE: Preg Test, Ur: NEGATIVE

## 2022-03-22 ENCOUNTER — Ambulatory Visit (HOSPITAL_COMMUNITY)
Admission: RE | Admit: 2022-03-22 | Discharge: 2022-03-22 | Disposition: A | Discharge status: Home / Self Care | Payer: No Typology Code available for payment source | Attending: Internal Medicine | Admitting: Internal Medicine

## 2022-03-22 ENCOUNTER — Encounter (HOSPITAL_COMMUNITY): Payer: Self-pay

## 2022-03-22 ENCOUNTER — Ambulatory Visit (HOSPITAL_BASED_OUTPATIENT_CLINIC_OR_DEPARTMENT_OTHER): Payer: No Typology Code available for payment source | Admitting: Anesthesiology

## 2022-03-22 ENCOUNTER — Ambulatory Visit (HOSPITAL_COMMUNITY): Payer: No Typology Code available for payment source | Admitting: Anesthesiology

## 2022-03-22 ENCOUNTER — Encounter (HOSPITAL_COMMUNITY)
Admission: RE | Disposition: A | Discharge status: Home / Self Care | Payer: Self-pay | Source: Home / Self Care | Attending: Internal Medicine

## 2022-03-22 DIAGNOSIS — K648 Other hemorrhoids: Secondary | ICD-10-CM | POA: Diagnosis not present

## 2022-03-22 DIAGNOSIS — Z800 Family history of malignant neoplasm of digestive organs: Secondary | ICD-10-CM | POA: Diagnosis not present

## 2022-03-22 DIAGNOSIS — K625 Hemorrhage of anus and rectum: Secondary | ICD-10-CM | POA: Diagnosis not present

## 2022-03-22 DIAGNOSIS — Z87891 Personal history of nicotine dependence: Secondary | ICD-10-CM | POA: Diagnosis not present

## 2022-03-22 DIAGNOSIS — K635 Polyp of colon: Secondary | ICD-10-CM

## 2022-03-22 DIAGNOSIS — D124 Benign neoplasm of descending colon: Secondary | ICD-10-CM | POA: Diagnosis not present

## 2022-03-22 DIAGNOSIS — K219 Gastro-esophageal reflux disease without esophagitis: Secondary | ICD-10-CM | POA: Insufficient documentation

## 2022-03-22 HISTORY — PX: COLONOSCOPY WITH PROPOFOL: SHX5780

## 2022-03-22 HISTORY — PX: POLYPECTOMY: SHX5525

## 2022-03-22 SURGERY — COLONOSCOPY WITH PROPOFOL
Anesthesia: General

## 2022-03-22 MED ORDER — LACTATED RINGERS IV SOLN: INTRAVENOUS | Status: DC | PRN

## 2022-03-22 MED ORDER — PROPOFOL 10 MG/ML IV BOLUS
INTRAVENOUS | Status: DC | PRN
  Administered 2022-03-22: 30 mg via INTRAVENOUS
  Administered 2022-03-22: 100 mg via INTRAVENOUS
  Administered 2022-03-22 (×3): 50 mg via INTRAVENOUS

## 2022-03-22 NOTE — Interval H&P Note (Signed)
History and Physical Interval Note:  03/22/2022 9:09 AM  Gloria Carlson  has presented today for surgery, with the diagnosis of rectal bleeding,family history of colon cancer.  The various methods of treatment have been discussed with the patient and family. After consideration of risks, benefits and other options for treatment, the patient has consented to  Procedure(s) with comments: COLONOSCOPY WITH PROPOFOL (N/A) - 1:15 pm as a surgical intervention.  The patient's history has been reviewed, patient examined, no change in status, stable for surgery.  I have reviewed the patient's chart and labs.  Questions were answered to the patient's satisfaction.     Eloise Harman

## 2022-03-22 NOTE — Discharge Instructions (Addendum)
  Colonoscopy Discharge Instructions  Read the instructions outlined below and refer to this sheet in the next few weeks. These discharge instructions provide you with general information on caring for yourself after you leave the hospital. Your doctor may also give you specific instructions. While your treatment has been planned according to the most current medical practices available, unavoidable complications occasionally occur.   ACTIVITY You may resume your regular activity, but move at a slower pace for the next 24 hours.  Take frequent rest periods for the next 24 hours.  Walking will help get rid of the air and reduce the bloated feeling in your belly (abdomen).  No driving for 24 hours (because of the medicine (anesthesia) used during the test).   Do not sign any important legal documents or operate any machinery for 24 hours (because of the anesthesia used during the test).  NUTRITION Drink plenty of fluids.  You may resume your normal diet as instructed by your doctor.  Begin with a light meal and progress to your normal diet. Heavy or fried foods are harder to digest and may make you feel sick to your stomach (nauseated).  Avoid alcoholic beverages for 24 hours or as instructed.  MEDICATIONS You may resume your normal medications unless your doctor tells you otherwise.  WHAT YOU CAN EXPECT TODAY Some feelings of bloating in the abdomen.  Passage of more gas than usual.  Spotting of blood in your stool or on the toilet paper.  IF YOU HAD POLYPS REMOVED DURING THE COLONOSCOPY: No aspirin products for 7 days or as instructed.  No alcohol for 7 days or as instructed.  Eat a soft diet for the next 24 hours.  FINDING OUT THE RESULTS OF YOUR TEST Not all test results are available during your visit. If your test results are not back during the visit, make an appointment with your caregiver to find out the results. Do not assume everything is normal if you have not heard from your  caregiver or the medical facility. It is important for you to follow up on all of your test results.  SEEK IMMEDIATE MEDICAL ATTENTION IF: You have more than a spotting of blood in your stool.  Your belly is swollen (abdominal distention).  You are nauseated or vomiting.  You have a temperature over 101.  You have abdominal pain or discomfort that is severe or gets worse throughout the day.   Your colonoscopy revealed 1 polyp(s) which I removed successfully. Await pathology results, my office will contact you. I recommend repeating colonoscopy in 5 years for surveillance purposes.   You also have internal hemorrhoids which is likely the cause of your bleeding.  We can consider hemorrhoid banding in the clinic to treat these.  Follow-up with GI in 3 months.  I hope you have a great rest of your week!  Elon Alas. Abbey Chatters, D.O. Gastroenterology and Hepatology [facility]

## 2022-03-22 NOTE — Anesthesia Preprocedure Evaluation (Signed)
Anesthesia Evaluation  Patient identified by MRN, date of birth, ID band Patient awake    Reviewed: Allergy & Precautions, H&P , NPO status , Patient's Chart, lab work & pertinent test results, reviewed documented beta blocker date and time   Airway Mallampati: II  TM Distance: >3 FB Neck ROM: full    Dental no notable dental hx.    Pulmonary asthma , former smoker,    Pulmonary exam normal breath sounds clear to auscultation       Cardiovascular Exercise Tolerance: Good negative cardio ROS   Rhythm:regular Rate:Normal     Neuro/Psych PSYCHIATRIC DISORDERS Anxiety negative neurological ROS     GI/Hepatic Neg liver ROS, GERD  Medicated,  Endo/Other  negative endocrine ROS  Renal/GU negative Renal ROS  negative genitourinary   Musculoskeletal   Abdominal   Peds  Hematology negative hematology ROS (+)   Anesthesia Other Findings   Reproductive/Obstetrics negative OB ROS                             Anesthesia Physical Anesthesia Plan  ASA: 2  Anesthesia Plan: General   Post-op Pain Management:    Induction:   PONV Risk Score and Plan: Propofol infusion  Airway Management Planned:   Additional Equipment:   Intra-op Plan:   Post-operative Plan:   Informed Consent: I have reviewed the patients History and Physical, chart, labs and discussed the procedure including the risks, benefits and alternatives for the proposed anesthesia with the patient or authorized representative who has indicated his/her understanding and acceptance.     Dental Advisory Given  Plan Discussed with: CRNA  Anesthesia Plan Comments:         Anesthesia Quick Evaluation

## 2022-03-22 NOTE — Transfer of Care (Signed)
Immediate Anesthesia Transfer of Care Note  Patient: Gloria Carlson  Procedure(s) Performed: COLONOSCOPY WITH PROPOFOL POLYPECTOMY  Patient Location: Short Stay  Anesthesia Type:General  Level of Consciousness: awake, alert  and oriented  Airway & Oxygen Therapy: Patient Spontanous Breathing  Post-op Assessment: Report given to RN and Post -op Vital signs reviewed and stable  Post vital signs: Reviewed and stable  Last Vitals:  Vitals Value Taken Time  BP 96/61 03/22/22 0936  Temp 36.6 C 03/22/22 0936  Pulse 83 03/22/22 0936  Resp 16 03/22/22 0936  SpO2 100 % 03/22/22 0936    Last Pain:  Vitals:   03/22/22 0936  TempSrc: Oral  PainSc: 0-No pain         Complications: No notable events documented.

## 2022-03-22 NOTE — Anesthesia Postprocedure Evaluation (Signed)
Anesthesia Post Note  Patient: Gloria Carlson  Procedure(s) Performed: COLONOSCOPY WITH PROPOFOL POLYPECTOMY  Patient location during evaluation: Phase II Anesthesia Type: General Level of consciousness: awake Pain management: pain level controlled Vital Signs Assessment: post-procedure vital signs reviewed and stable Respiratory status: spontaneous breathing and respiratory function stable Cardiovascular status: blood pressure returned to baseline and stable Postop Assessment: no headache and no apparent nausea or vomiting Anesthetic complications: no Comments: Late entry   No notable events documented.   Last Vitals:  Vitals:   03/22/22 0805 03/22/22 0936  BP: (!) 116/56 96/61  Pulse: 82 83  Resp: 16 16  Temp: 36.7 C 36.6 C  SpO2: 97% 100%    Last Pain:  Vitals:   03/22/22 0936  TempSrc: Oral  PainSc: 0-No pain                 Louann Sjogren

## 2022-03-22 NOTE — Op Note (Signed)
[facility] Patient Name: Gloria Carlson Procedure Date: 03/22/2022 9:09 AM MRN: [id number] Date of Birth: [birth year] Attending MD: Elon Alas. Abbey Chatters DO CSN: [id number] Age: 41 Admit Type: Outpatient Procedure:                Colonoscopy Indications:              Rectal bleeding Providers:                Elon Alas. Abbey Chatters, DO, Caprice Kluver, Ladoris Gene                            Technician, Technician, Aram Candela Referring MD:              Medicines:                See the Anesthesia note for documentation of the                            administered medications Complications:            No immediate complications. Estimated Blood Loss:     Estimated blood loss was minimal. Procedure:                Pre-Anesthesia Assessment:                           - The anesthesia plan was to use monitored                            anesthesia care (MAC).                           After obtaining informed consent, the colonoscope                            was passed under direct vision. Throughout the                            procedure, the patient's blood pressure, pulse, and                            oxygen saturations were monitored continuously. The                            [id number]-HQ190L ([id number]) scope was introduced through                            the anus and advanced to the the cecum, identified                            by appendiceal orifice and ileocecal valve. The                            colonoscopy was performed without difficulty. The                            patient tolerated the procedure well. The quality  of the bowel preparation was evaluated using the                            BBPS [facility] Bowel Preparation Scale) with scores                            of: Right Colon = 3, Transverse Colon = 3 and Left                            Colon = 3 (entire mucosa seen well with no residual                            staining, small fragments  of stool or opaque                            liquid). The total BBPS score equals 9. Scope In: 9:20:37 AM Scope Out: 9:32:04 AM Scope Withdrawal Time: 0 hours 6 minutes 55 seconds  Total Procedure Duration: 0 hours 11 minutes 27 seconds  Findings:      The perianal and digital rectal examinations were normal.      Non-bleeding internal hemorrhoids were found during endoscopy.      A 4 mm polyp was found in the descending colon. The polyp was sessile.       The polyp was removed with a cold snare. Resection and retrieval were       complete.      The exam was otherwise without abnormality. Impression:               - Non-bleeding internal hemorrhoids.                           - One 4 mm polyp in the descending colon, removed                            with a cold snare. Resected and retrieved.                           - The examination was otherwise normal. Moderate Sedation:      Per Anesthesia Care Recommendation:           - Patient has a contact number available for                            emergencies. The signs and symptoms of potential                            delayed complications were discussed with the                            patient. Return to normal activities tomorrow.                            Written discharge instructions were provided to the  patient.                           - Resume previous diet.                           - Continue present medications.                           - Await pathology results.                           - Repeat colonoscopy in 5 years for surveillance                            and family history of colon cancer in mother                           - Return to GI clinic in 3 months. Consider                            hemorrhoid banding if bleeding continues. Procedure Code(s):        --- Professional ---                           [id number], Colonoscopy, flexible; with removal of                             tumor(s), polyp(s), or other lesion(s) by snare                            technique Diagnosis Code(s):        --- Professional ---                           K63.5, Polyp of colon                           K64.8, Other hemorrhoids                           K62.5, Hemorrhage of anus and rectum CPT copyright 2019 American Medical Association. All rights reserved. The codes documented in this report are preliminary and upon coder review may  be revised to meet current compliance requirements. Elon Alas. Abbey Chatters, DO Grannis Abbey Chatters, DO 03/22/2022 9:34:37 AM This report has been signed electronically. Number of Addenda: 0

## 2022-03-22 NOTE — Progress Notes (Signed)
To whom it may concern,  Gloria Carlson DOB Oct 04, 1980 can not drive, or be left alone until 10am on 03/23/22. She may return to work after this time. Thank you  Tevis Conger RN

## 2022-03-23 LAB — SURGICAL PATHOLOGY

## 2022-03-26 ENCOUNTER — Other Ambulatory Visit: Payer: Self-pay | Admitting: Nurse Practitioner

## 2022-03-26 DIAGNOSIS — M5489 Other dorsalgia: Secondary | ICD-10-CM

## 2022-03-30 ENCOUNTER — Encounter (HOSPITAL_COMMUNITY): Payer: Self-pay | Admitting: Internal Medicine

## 2022-03-30 NOTE — Telephone Encounter (Signed)
Schedule phone visit, so I can reassess need for medication . Thank you

## 2022-03-31 NOTE — Telephone Encounter (Signed)
Called to schedule and patient said the medicine had been filled so she did not need an appt at this time.

## 2022-03-31 NOTE — Telephone Encounter (Signed)
Ivy Lynn, NP  03/30/22 3:58 PM Schedule phone visit, so I can reassess need for medication . Thank you    RF denied NTBS

## 2022-06-24 ENCOUNTER — Encounter: Payer: No Typology Code available for payment source | Admitting: Gastroenterology

## 2022-11-23 ENCOUNTER — Telehealth: Payer: Self-pay | Admitting: Nurse Practitioner

## 2022-11-23 NOTE — Telephone Encounter (Signed)
Je patient - she has not est with new PCP yet.  Patient is c/o of right breast pain; no mass or lump felt - pt does regular self breast exams.  Can we order diagnostic mammogram and possible ultrasound or does patient need an OV first

## 2022-11-23 NOTE — Telephone Encounter (Signed)
Need more information as to why the patient needs diagnostic imaging of the breast so it can be ordered correctly.  Called patient lmtcb

## 2022-11-23 NOTE — Telephone Encounter (Signed)
Pt is asking for a dx referral to be sent to [facility] in [place] on [address].

## 2022-11-24 NOTE — Telephone Encounter (Signed)
Patient needs an appointment, this is an acute issue so please schedule her with what ever is available to be seen for this issue and then she can reestablish with a new PCP in the future.  Once we do the exam then we can order the diagnostic mammogram based on the exam findings

## 2022-11-24 NOTE — Telephone Encounter (Signed)
Called pt to schedule appt, pt was upset that she had to make appointment to be seen and have referral for mammogram. Pt declined to make appointment at this time

## 2023-02-09 ENCOUNTER — Other Ambulatory Visit: Payer: Self-pay | Admitting: Women's Health

## 2023-02-09 DIAGNOSIS — Z1231 Encounter for screening mammogram for malignant neoplasm of breast: Secondary | ICD-10-CM

## 2023-02-23 ENCOUNTER — Ambulatory Visit
Admission: RE | Admit: 2023-02-23 | Discharge: 2023-02-23 | Disposition: A | Discharge status: Home / Self Care | Payer: No Typology Code available for payment source | Source: Ambulatory Visit | Attending: Women's Health | Admitting: Women's Health

## 2023-02-23 DIAGNOSIS — Z1231 Encounter for screening mammogram for malignant neoplasm of breast: Secondary | ICD-10-CM

## 2023-05-25 ENCOUNTER — Other Ambulatory Visit: Payer: Self-pay | Admitting: Gastroenterology

## 2023-05-30 NOTE — Telephone Encounter (Signed)
Pt needs appt before any more refills

## 2023-06-07 NOTE — Telephone Encounter (Signed)
Left pt a message to get scehduled for OV

## 2023-06-14 ENCOUNTER — Ambulatory Visit: Payer: No Typology Code available for payment source | Admitting: Nurse Practitioner

## 2023-09-30 ENCOUNTER — Encounter: Payer: Self-pay | Admitting: Family Medicine

## 2023-09-30 ENCOUNTER — Ambulatory Visit: Payer: No Typology Code available for payment source | Admitting: Family Medicine

## 2023-09-30 VITALS — BP 103/70 | HR 77 | Ht 68.0 in | Wt 279.1 lb

## 2023-09-30 DIAGNOSIS — M545 Low back pain, unspecified: Secondary | ICD-10-CM

## 2023-09-30 DIAGNOSIS — F419 Anxiety disorder, unspecified: Secondary | ICD-10-CM

## 2023-09-30 DIAGNOSIS — Z6841 Body Mass Index (BMI) 40.0 and over, adult: Secondary | ICD-10-CM

## 2023-09-30 DIAGNOSIS — J309 Allergic rhinitis, unspecified: Secondary | ICD-10-CM | POA: Diagnosis not present

## 2023-09-30 DIAGNOSIS — E038 Other specified hypothyroidism: Secondary | ICD-10-CM

## 2023-09-30 DIAGNOSIS — M549 Dorsalgia, unspecified: Secondary | ICD-10-CM

## 2023-09-30 DIAGNOSIS — Z136 Encounter for screening for cardiovascular disorders: Secondary | ICD-10-CM

## 2023-09-30 DIAGNOSIS — E559 Vitamin D deficiency, unspecified: Secondary | ICD-10-CM

## 2023-09-30 DIAGNOSIS — R7301 Impaired fasting glucose: Secondary | ICD-10-CM

## 2023-09-30 DIAGNOSIS — E66813 Obesity, class 3: Secondary | ICD-10-CM

## 2023-09-30 MED ORDER — ALPRAZOLAM 1 MG PO TABS: 1.0000 mg | ORAL_TABLET | Freq: Every day | ORAL | 0 refills | Status: DC | PRN

## 2023-09-30 MED ORDER — FLUTICASONE PROPIONATE 50 MCG/ACT NA SUSP: 2.0000 | Freq: Every day | NASAL | 6 refills | Status: AC

## 2023-09-30 MED ORDER — AZELASTINE HCL 0.1 % NA SOLN: 1.0000 | Freq: Two times a day (BID) | NASAL | 0 refills | Status: AC

## 2023-09-30 MED ORDER — IBUPROFEN 800 MG PO TABS: 800.0000 mg | ORAL_TABLET | Freq: Three times a day (TID) | ORAL | 0 refills | Status: AC | PRN

## 2023-09-30 MED ORDER — CYCLOBENZAPRINE HCL 5 MG PO TABS: 5.0000 mg | ORAL_TABLET | Freq: Two times a day (BID) | ORAL | 2 refills | Status: AC | PRN

## 2023-09-30 NOTE — Assessment & Plan Note (Signed)
 Refilled Xanax 1 mg PRN, Patient is not interested in long term SRRI use We discussed several non-pharmacological approaches to managing anxiety and depression, including:  Establishing a consistent daily routine: This helps create structure and stability. Practicing mindfulness and relaxation techniques: Incorporating meditation, deep breathing exercises, or yoga to manage stress and improve emotional well-being. Engaging in regular physical activity: Aim for at least 30 minutes of exercise most days to boost mood and energy levels. Spending time outdoors: Exposure to natural light and fresh air can improve mental health. Building a support network: Encouraging social connections with friends, family, or support groups to reduce feelings of isolation. Prioritizing a balanced diet: Eating nutrient-rich foods while avoiding excessive amounts of processed foods, sugar, and unhealthy fats. Follow-up is recommended in 4-8 weeks to assess progress, with a referral to behavioral health for further support if needed.  Patient verbally consented to [facility] services about presenting concerns and psychiatric consultation as appropriate. The services will be billed as appropriate for the patient.

## 2023-09-30 NOTE — Assessment & Plan Note (Signed)
 Xray ordered awaiting results Can take Flexeril 5 mg PRN We discussed the desired effects and potential side effects of the prescribed medication for back pain. Additionally, we reviewed non-pharmacological interventions, including the importance of rest, avoiding twisting, improper bending, and straining the lower back. I demonstrated proper body mechanics to prevent further injury and advised alternating between ice and heat therapy for relief. Stretching exercises for both the back and legs were recommended to improve flexibility and support recovery. The patient was advised to follow up if symptoms worsen or persist. The patient expressed understanding of the treatment plan, and all questions were thoroughly addressed.

## 2023-09-30 NOTE — Assessment & Plan Note (Signed)
 Discussed Eat a Balanced Diet: Focus on whole, nutrient-dense foods like lean proteins, vegetables, fruits, whole grains, and healthy fats while avoiding processed and sugary foods. Stay Active: Incorporate at least 30 minutes of moderate physical activity most days of the week, such as walking, jogging, or strength training. Hydrate and Rest: Drink plenty of water throughout the day and ensure you get 7-9 hours of quality sleep each night to support metabolism and recovery. Practice Portion Control: Use smaller plates, measure portions, and eat mindfully to avoid overeating and manage calorie intake effectively.

## 2023-09-30 NOTE — Patient Instructions (Addendum)

## 2023-09-30 NOTE — Progress Notes (Signed)
 New Patient Office Visit   Subjective   Patient ID: Gloria Carlson, female    DOB: 08-Feb-1981  Age: 43 y.o. MRN: [id number]  CC:  Chief Complaint  Patient presents with   Establish Care    Suffers from panic attacks and would like to renew prescription.  Bursitis in legs would like to review medication and treatment plan.  Referral to Nutrition  for wt. Loss     HPI Gloria Carlson 43 year old female, presents to establish care. She  has a past medical history of Allergies, Anxiety, and Asthma.  Patient presents for an initial visit with complaints of anxiety. Symptoms include muscle tension, nervous behavior, panic, and restlessness. The patient denies experiencing chest pain or shortness of breath. These symptoms occur on most days and have become severe enough to interfere with daily activities, causing significant distress. The symptoms are further aggravated by ongoing marital issues. The patient reports sleeping 8 hours per night with good sleep quality. Risk factors for her anxiety include marital problems. Her medical history is notable for anxiety and panic attacks, for which she has previously been treated with benzodiazepines. She has demonstrated good compliance with past treatments.  The patient also reports chronic, intermittent back pain, which remains unchanged. The pain is localized to the lumbar spine and is described as aching, radiating to both hips. The pain severity is rated 7/10 and remains consistent over time. Symptoms worsen with bending or certain positions. Additional associated symptoms include headaches, leg pain, and numbness. The patient denies experiencing abdominal pain, chest pain, or fever. Risk factors for the back pain include obesity. She has used NSAIDs for relief, which have provided significant improvement.        Outpatient Encounter Medications as of 09/30/2023  Medication Sig   ALPRAZolam (XANAX) 1 MG tablet Take 1 tablet (1 mg total) by mouth  daily as needed for anxiety.   cetirizine (ZYRTEC) 10 MG tablet Take 10 mg by mouth daily.   naproxen (NAPROSYN) 500 MG tablet Take 1 tablet (500 mg total) by mouth 2 (two) times daily with a meal. (Patient taking differently: Take 500 mg by mouth daily as needed for mild pain (pain score 1-3) or moderate pain (pain score 4-6).)   pantoprazole (PROTONIX) 40 MG tablet Take 1 tablet (40 mg total) by mouth daily.   paragard intrauterine copper IUD IUD 1 each by Intrauterine route once.   [DISCONTINUED] ALPRAZolam (XANAX) 0.5 MG tablet Take 1 tablet (0.5 mg total) by mouth 2 (two) times daily as needed for anxiety.   [DISCONTINUED] azelastine (ASTELIN) 0.1 % nasal spray Place 1 spray into both nostrils 2 (two) times daily. Use in each nostril as directed (Patient taking differently: Place 1 spray into both nostrils daily as needed for rhinitis or allergies. Use in each nostril as directed)   [DISCONTINUED] cyclobenzaprine (FLEXERIL) 5 MG tablet Take 1 tablet (5 mg total) by mouth 3 (three) times daily as needed for muscle spasms.   [DISCONTINUED] fluticasone (FLONASE) 50 MCG/ACT nasal spray Place 2 sprays into both nostrils daily. (Patient taking differently: Place 2 sprays into both nostrils daily as needed for allergies or rhinitis.)   [DISCONTINUED] ibuprofen (ADVIL) 800 MG tablet Take 800 mg by mouth every 8 (eight) hours as needed for moderate pain (pain score 4-6).   azelastine (ASTELIN) 0.1 % nasal spray Place 1 spray into both nostrils 2 (two) times daily. Use in each nostril as directed   cyclobenzaprine (FLEXERIL) 5 MG tablet Take 1 tablet (  5 mg total) by mouth 2 (two) times daily as needed for muscle spasms.   fluticasone (FLONASE) 50 MCG/ACT nasal spray Place 2 sprays into both nostrils daily.   ibuprofen (ADVIL) 800 MG tablet Take 1 tablet (800 mg total) by mouth every 8 (eight) hours as needed for moderate pain (pain score 4-6).   Vitamin D, Ergocalciferol, (DRISDOL) 1.25 MG (50000 UNIT) CAPS  capsule Take 1 capsule (50,000 Units total) by mouth every 7 (seven) days. (Patient not taking: Reported on 09/30/2023)   [DISCONTINUED] paragard intrauterine copper IUD IUD 1 each by Intrauterine route once.   No facility-administered encounter medications on file as of 09/30/2023.    Past Surgical History:  Procedure Laterality Date   COLONOSCOPY WITH PROPOFOL N/A 03/22/2022   Procedure: COLONOSCOPY WITH PROPOFOL;  Surgeon: Lanelle Bal, DO;  Location: [facility];  Service: Endoscopy;  Laterality: N/A;  1:15 pm   None     POLYPECTOMY  03/22/2022   Procedure: POLYPECTOMY;  Surgeon: Lanelle Bal, DO;  Location: [facility];  Service: Endoscopy;;    Review of Systems  Constitutional:  Negative for chills and fever.  Eyes:  Positive for blurred vision. Negative for pain.  Respiratory:  Negative for shortness of breath.   Cardiovascular:  Negative for chest pain.  Gastrointestinal:  Negative for abdominal pain.  Musculoskeletal:  Positive for back pain and myalgias.  Neurological:  Positive for headaches.  Psychiatric/Behavioral:  The patient is nervous/anxious.       Objective    BP 103/70   Pulse 77   Ht 5\' 8"  (1.727 m)   Wt 279 lb 1.3 oz (126.6 kg)   LMP 09/02/2023 (Approximate)   SpO2 96%   BMI 42.43 kg/m   Physical Exam Vitals reviewed.  Constitutional:      General: She is not in acute distress.    Appearance: Normal appearance. She is not ill-appearing, toxic-appearing or diaphoretic.  HENT:     Head: Normocephalic.     Right Ear: Tympanic membrane normal.     Left Ear: Tympanic membrane normal.     Mouth/Throat:     Mouth: Mucous membranes are moist.  Eyes:     General:        Right eye: No discharge.        Left eye: No discharge.     Conjunctiva/sclera: Conjunctivae normal.     Pupils: Pupils are equal, round, and reactive to light.  Cardiovascular:     Rate and Rhythm: Normal rate.     Pulses: Normal pulses.     Heart sounds: Normal heart  sounds.  Pulmonary:     Effort: Pulmonary effort is normal. No respiratory distress.     Breath sounds: Normal breath sounds.  Abdominal:     General: Bowel sounds are normal.     Palpations: Abdomen is soft.     Tenderness: There is no abdominal tenderness. There is no right CVA tenderness, left CVA tenderness or guarding.  Musculoskeletal:        General: Normal range of motion.     Cervical back: Normal range of motion.  Skin:    General: Skin is warm and dry.     Capillary Refill: Capillary refill takes less than 2 seconds.  Neurological:     Mental Status: She is alert.     Coordination: Coordination normal.     Gait: Gait normal.  Psychiatric:        Mood and Affect: Mood normal.  Behavior: Behavior normal.       Assessment & Plan:  Screening for cardiovascular condition -     Lipid panel -     CMP14+EGFR -     CBC with Differential/Platelet  Vitamin D deficiency -     VITAMIN D 25 Hydroxy (Vit-D Deficiency, Fractures)  TSH (thyroid-stimulating hormone deficiency) -     TSH + free T4  IFG (impaired fasting glucose) -     Hemoglobin A1c  Allergic rhinitis, unspecified seasonality, unspecified trigger -     Fluticasone Propionate; Place 2 sprays into both nostrils daily.  Dispense: 16 g; Refill: 6 -     Azelastine HCl; Place 1 spray into both nostrils 2 (two) times daily. Use in each nostril as directed  Dispense: 30 mL; Refill: 0  Back pain without sciatica -     Cyclobenzaprine HCl; Take 1 tablet (5 mg total) by mouth 2 (two) times daily as needed for muscle spasms.  Dispense: 60 tablet; Refill: 2  Anxiety Assessment & Plan: Refilled Xanax 1 mg PRN, Patient is not interested in long term SRRI use We discussed several non-pharmacological approaches to managing anxiety and depression, including:  Establishing a consistent daily routine: This helps create structure and stability. Practicing mindfulness and relaxation techniques: Incorporating meditation,  deep breathing exercises, or yoga to manage stress and improve emotional well-being. Engaging in regular physical activity: Aim for at least 30 minutes of exercise most days to boost mood and energy levels. Spending time outdoors: Exposure to natural light and fresh air can improve mental health. Building a support network: Encouraging social connections with friends, family, or support groups to reduce feelings of isolation. Prioritizing a balanced diet: Eating nutrient-rich foods while avoiding excessive amounts of processed foods, sugar, and unhealthy fats. Follow-up is recommended in 4-8 weeks to assess progress, with a referral to behavioral health for further support if needed.  Patient verbally consented to [facility] services about presenting concerns and psychiatric consultation as appropriate. The services will be billed as appropriate for the patient.     Orders: -     Ambulatory referral to Behavioral Health  Class 3 severe obesity due to excess calories with serious comorbidity and body mass index (BMI) of 40.0 to 44.9 in adult [facility]) Assessment & Plan: Discussed Eat a Balanced Diet: Focus on whole, nutrient-dense foods like lean proteins, vegetables, fruits, whole grains, and healthy fats while avoiding processed and sugary foods. Stay Active: Incorporate at least 30 minutes of moderate physical activity most days of the week, such as walking, jogging, or strength training. Hydrate and Rest: Drink plenty of water throughout the day and ensure you get 7-9 hours of quality sleep each night to support metabolism and recovery. Practice Portion Control: Use smaller plates, measure portions, and eat mindfully to avoid overeating and manage calorie intake effectively.   Orders: -     Amb Ref to Medical Weight Management  Lumbar pain Assessment & Plan: Xray ordered awaiting results Can take Flexeril 5 mg PRN We discussed the desired effects and potential side  effects of the prescribed medication for back pain. Additionally, we reviewed non-pharmacological interventions, including the importance of rest, avoiding twisting, improper bending, and straining the lower back. I demonstrated proper body mechanics to prevent further injury and advised alternating between ice and heat therapy for relief. Stretching exercises for both the back and legs were recommended to improve flexibility and support recovery. The patient was advised to follow up if symptoms worsen or  persist. The patient expressed understanding of the treatment plan, and all questions were thoroughly addressed.   Orders: -     DG Lumbar Spine 2-3 Views; Future  Other orders -     Ibuprofen; Take 1 tablet (800 mg total) by mouth every 8 (eight) hours as needed for moderate pain (pain score 4-6).  Dispense: 90 tablet; Refill: 0 -     ALPRAZolam; Take 1 tablet (1 mg total) by mouth daily as needed for anxiety.  Dispense: 30 tablet; Refill: 0    Return in about 6 months (around 03/31/2024), or if symptoms worsen or fail to improve, for Follow up.   Cruzita Lederer Newman Nip, FNP

## 2023-10-07 ENCOUNTER — Encounter: Payer: Self-pay | Admitting: Family Medicine

## 2023-10-07 LAB — CMP14+EGFR
ALT: 10 IU/L (ref 0–32)
AST: 9 IU/L (ref 0–40)
Albumin: 3.9 g/dL (ref 3.9–4.9)
Alkaline Phosphatase: 86 IU/L (ref 44–121)
BUN/Creatinine Ratio: 11 (ref 9–23)
BUN: 11 mg/dL (ref 6–24)
Bilirubin Total: 0.3 mg/dL (ref 0.0–1.2)
CO2: 23 mmol/L (ref 20–29)
Calcium: 9 mg/dL (ref 8.7–10.2)
Chloride: 104 mmol/L (ref 96–106)
Creatinine, Ser: 1.02 mg/dL — ABNORMAL HIGH (ref 0.57–1.00)
Globulin, Total: 2.6 g/dL (ref 1.5–4.5)
Glucose: 87 mg/dL (ref 70–99)
Potassium: 4.5 mmol/L (ref 3.5–5.2)
Sodium: 138 mmol/L (ref 134–144)
Total Protein: 6.5 g/dL (ref 6.0–8.5)
eGFR: 70 mL/min/{1.73_m2} (ref >59)

## 2023-10-07 LAB — LIPID PANEL
Chol/HDL Ratio: 2.5 ratio (ref 0.0–4.4)
Cholesterol, Total: 199 mg/dL (ref 100–199)
HDL: 81 mg/dL (ref >39)
LDL Chol Calc (NIH): 108 mg/dL — ABNORMAL HIGH (ref 0–99)
Triglycerides: 55 mg/dL (ref 0–149)
VLDL Cholesterol Cal: 10 mg/dL (ref 5–40)

## 2023-10-07 LAB — CBC WITH DIFFERENTIAL/PLATELET
Basophils Absolute: 0.1 10*3/uL (ref 0.0–0.2)
Basos: 1 %
EOS (ABSOLUTE): 0.8 10*3/uL — ABNORMAL HIGH (ref 0.0–0.4)
Eos: 11 %
Hematocrit: 35 % (ref 34.0–46.6)
Hemoglobin: 11.2 g/dL (ref 11.1–15.9)
Immature Grans (Abs): 0 10*3/uL (ref 0.0–0.1)
Immature Granulocytes: 0 %
Lymphocytes Absolute: 1.9 10*3/uL (ref 0.7–3.1)
Lymphs: 25 %
MCH: 26 pg — ABNORMAL LOW (ref 26.6–33.0)
MCHC: 32 g/dL (ref 31.5–35.7)
MCV: 81 fL (ref 79–97)
Monocytes Absolute: 0.6 10*3/uL (ref 0.1–0.9)
Monocytes: 8 %
Neutrophils Absolute: 4 10*3/uL (ref 1.4–7.0)
Neutrophils: 55 %
Platelets: 315 10*3/uL (ref 150–450)
RBC: 4.3 x10E6/uL (ref 3.77–5.28)
RDW: 13.9 % (ref 11.7–15.4)
WBC: 7.4 10*3/uL (ref 3.4–10.8)

## 2023-10-07 LAB — TSH+FREE T4
Free T4: 1.13 ng/dL (ref 0.82–1.77)
TSH: 1.47 u[IU]/mL (ref 0.450–4.500)

## 2023-10-07 LAB — VITAMIN D 25 HYDROXY (VIT D DEFICIENCY, FRACTURES): Vit D, 25-Hydroxy: 19.3 ng/mL — ABNORMAL LOW (ref 30.0–100.0)

## 2023-10-07 LAB — HEMOGLOBIN A1C
Est. average glucose Bld gHb Est-mCnc: 105 mg/dL
Hgb A1c MFr Bld: 5.3 % (ref 4.8–5.6)

## 2023-12-01 ENCOUNTER — Ambulatory Visit: Payer: Self-pay

## 2023-12-01 NOTE — Telephone Encounter (Signed)
 Patient scheduled with gloria on 12/02/2023

## 2023-12-01 NOTE — Telephone Encounter (Signed)
 FYI Only or Action Required?: FYI only for provider  Patient was last seen in primary care on 09/30/2023 by Rosanna Comment, FNP. Called Nurse Triage reporting Abdominal Pain. Symptoms began a week ago. Interventions attempted: Rest, hydration, or home remedies. Symptoms are: unchanged.  Triage Disposition: See Physician Within 24 Hours  Patient/caregiver understands and will follow disposition?: Yes Reason for Disposition . [1] MODERATE pain (e.g., interferes with normal activities) AND [2] pain comes and goes (cramps) AND [3] present > 24 hours  (Exception: Pain with Vomiting or Diarrhea - see that Guideline.)  Answer Assessment - Initial Assessment Questions 1. LOCATION: "Where does it hurt?"      Pt calling in with c/o pain in right lower back area.   Over my kidney area.   No urinary odor or burning.   I noticed it Mon or Tues.     I drank a lot of water. 2. RADIATION: "Does the pain shoot anywhere else?" (e.g., chest, back)     I do have some lower pain in my leg in calf shin area.   I've been doing walking for exercise.   3. ONSET: "When did the pain begin?" (e.g., minutes, hours or days ago)      Mon. Or Tues. 4. SUDDEN: "Gradual or sudden onset?"     Not askd 5. PATTERN "Does the pain come and go, or is it constant?"    - If it comes and goes: "How long does it last?" "Do you have pain now?"     (Note: Comes and goes means the pain is intermittent. It goes away completely between bouts.)    - If constant: "Is it getting better, staying the same, or getting worse?"      (Note: Constant means the pain never goes away completely; most serious pain is constant and gets worse.)      It's just there.  6. SEVERITY: "How bad is the pain?"  (e.g., Scale 1-10; mild, moderate, or severe)    - MILD (1-3): Doesn't interfere with normal activities, abdomen soft and not tender to touch.     - MODERATE (4-7): Interferes with normal activities or awakens from sleep, abdomen tender to  touch.     - SEVERE (8-10): Excruciating pain, doubled over, unable to do any normal activities.       7/10 7. RECURRENT SYMPTOM: "Have you ever had this type of stomach pain before?" If Yes, ask: "When was the last time?" and "What happened that time?"      Maybe many years ago 8. CAUSE: "What do you think is causing the stomach pain?"     Back pain maybe coming from kidney infection or dehydration.   9. RELIEVING/AGGRAVATING FACTORS: "What makes it better or worse?" (e.g., antacids, bending or twisting motion, bowel movement)     Noting 10. OTHER SYMPTOMS: "Do you have any other symptoms?" (e.g., back pain, diarrhea, fever, urination pain, vomiting)       No 11. PREGNANCY: "Is there any chance you are pregnant?" "When was your last menstrual period?"       Not asked  Protocols used: Abdominal Pain - [facility]

## 2023-12-01 NOTE — Telephone Encounter (Signed)
 1st attempt, no answer. Left voicemail for patient to return call for nurse triage and to schedule an appointment for illness.     Copied from CRM [id number]. Topic: Clinical - Medical Advice >> Dec 01, 2023  2:07 PM Turkey B wrote: Pt called in has only mild pain on the right side, no appt until July, and mobile bus too far in [place] for today, I called back and left and message to let  her know about the one in [facility] Monday, not sure if that will work

## 2023-12-01 NOTE — Telephone Encounter (Signed)
 OFFICE VISIT OR URGENT CARE

## 2023-12-02 ENCOUNTER — Encounter: Payer: Self-pay | Admitting: Family Medicine

## 2023-12-02 ENCOUNTER — Ambulatory Visit: Payer: Self-pay | Admitting: Family Medicine

## 2023-12-02 VITALS — BP 116/86 | HR 68 | Resp 16 | Ht 68.0 in | Wt 287.0 lb

## 2023-12-02 DIAGNOSIS — M5416 Radiculopathy, lumbar region: Secondary | ICD-10-CM

## 2023-12-02 DIAGNOSIS — M549 Dorsalgia, unspecified: Secondary | ICD-10-CM | POA: Diagnosis not present

## 2023-12-02 NOTE — Progress Notes (Signed)
 Established Patient Office Visit  Subjective:  Patient ID: Gloria Carlson, female    DOB: 03-25-1981  Age: 43 y.o. MRN: [id number]  CC:  Chief Complaint  Patient presents with   Back Pain    Lower right sided back pain that started Mon, was radiating down her right leg yesterday. Rates pain 5/10    HPI Gloria Carlson is a 43 y.o. female  presents with the above complaints with no recent injury or trauma reported.  For the details of today's visit, please refer to the assessment and plan.    Past Medical History:  Diagnosis Date   Allergies    Anxiety    Asthma    as child    Past Surgical History:  Procedure Laterality Date   COLONOSCOPY WITH PROPOFOL  N/A 03/22/2022   Procedure: COLONOSCOPY WITH PROPOFOL ;  Surgeon: Vinetta Greening, DO;  [facility];  Service: Endoscopy;  Laterality: N/A;  1:15 pm   None     POLYPECTOMY  03/22/2022   Procedure: POLYPECTOMY;  Surgeon: Vinetta Greening, DO;  [facility];  Service: Endoscopy;;    Family History  Problem Relation Age of Onset   Hyperlipidemia Mother    Colon cancer Mother    Hypertension Father     Social History   Socioeconomic History   Marital status: Legally Separated    Spouse name: Not on file   Number of children: 1   Years of education: Not on file   Highest education level: Some college, no degree  Occupational History   Occupation: [employer]  Tobacco Use   Smoking status: Former   Smokeless tobacco: Never  Advertising account planner   Vaping status: Never Used  Substance and Sexual Activity   Alcohol use: Yes    Comment: occ   Drug use: No   Sexual activity: Yes  Other Topics Concern   Not on file  Social History Narrative   Not on file   Social Drivers of Health   Financial Resource Strain: Not on file  Food Insecurity: Not on file  Transportation Needs: Not on file  Physical Activity: Not on file  Stress: Not on file  Social Connections: Not on file  Intimate  Partner Violence: Not At Risk (07/08/2021)   Received from [facility]   Humiliation, Afraid, Rape, and Kick questionnaire    Fear of Current or Ex-Partner: No    Emotionally Abused: No    Physically Abused: No    Sexually Abused: No    Outpatient Medications Prior to Visit  Medication Sig Dispense Refill   ALPRAZolam  (XANAX ) 1 MG tablet Take 1 tablet (1 mg total) by mouth daily as needed for anxiety. 30 tablet 0   azelastine  (ASTELIN ) 0.1 % nasal spray Place 1 spray into both nostrils 2 (two) times daily. Use in each nostril as directed 30 mL 0   cetirizine (ZYRTEC) 10 MG tablet Take 10 mg by mouth daily.     cyclobenzaprine  (FLEXERIL ) 5 MG tablet Take 1 tablet (5 mg total) by mouth 2 (two) times daily as needed for muscle spasms. 60 tablet 2   fluticasone  (FLONASE ) 50 MCG/ACT nasal spray Place 2 sprays into both nostrils daily. 16 g 6   ibuprofen  (ADVIL ) 800 MG tablet Take 1 tablet (800 mg total) by mouth every 8 (eight) hours as needed for moderate pain (pain score 4-6). 90 tablet 0   pantoprazole  (PROTONIX ) 40 MG tablet Take 1 tablet (  40 mg total) by mouth daily. 30 tablet 0   paragard intrauterine copper IUD IUD 1 each by Intrauterine route once.     Vitamin D , Ergocalciferol , (DRISDOL ) 1.25 MG (50000 UNIT) CAPS capsule Take 1 capsule (50,000 Units total) by mouth every 7 (seven) days. 7 capsule 0   naproxen  (NAPROSYN ) 500 MG tablet Take 1 tablet (500 mg total) by mouth 2 (two) times daily with a meal. (Patient taking differently: Take 500 mg by mouth daily as needed for mild pain (pain score 1-3) or moderate pain (pain score 4-6).) 30 tablet 0   No facility-administered medications prior to visit.    Allergies  Allergen Reactions   Almond (Diagnostic) Hives and Swelling   Macrodantin [Nitrofurantoin Macrocrystal] Itching and Rash    ROS Review of Systems  Constitutional:  Negative for chills and fever.  Eyes:  Negative for visual disturbance.  Respiratory:   Negative for chest tightness and shortness of breath.   Musculoskeletal:  Positive for back pain.  Neurological:  Negative for dizziness and headaches.      Objective:     Physical Exam HENT:     Head: Normocephalic.     Mouth/Throat:     Mouth: Mucous membranes are moist.  Cardiovascular:     Rate and Rhythm: Normal rate.     Heart sounds: Normal heart sounds.  Pulmonary:     Effort: Pulmonary effort is normal.     Breath sounds: Normal breath sounds.  Neurological:     Mental Status: She is alert.     BP 116/86   Pulse 68   Resp 16   Ht 5\' 8"  (1.727 m)   Wt 287 lb (130.2 kg)   SpO2 97%   BMI 43.64 kg/m  Wt Readings from Last 3 Encounters:  12/02/23 287 lb (130.2 kg)  09/30/23 279 lb 1.3 oz (126.6 kg)  03/22/22 283 lb 9.6 oz (128.6 kg)    Lab Results  Component Value Date   TSH 1.470 10/06/2023   Lab Results  Component Value Date   WBC 7.4 10/06/2023   HGB 11.2 10/06/2023   HCT 35.0 10/06/2023   MCV 81 10/06/2023   PLT 315 10/06/2023   Lab Results  Component Value Date   NA 138 10/06/2023   K 4.5 10/06/2023   CO2 23 10/06/2023   GLUCOSE 87 10/06/2023   BUN 11 10/06/2023   CREATININE 1.02 (H) 10/06/2023   BILITOT 0.3 10/06/2023   ALKPHOS 86 10/06/2023   AST 9 10/06/2023   ALT 10 10/06/2023   PROT 6.5 10/06/2023   ALBUMIN 3.9 10/06/2023   CALCIUM 9.0 10/06/2023   EGFR 70 10/06/2023   Lab Results  Component Value Date   CHOL 199 10/06/2023   Lab Results  Component Value Date   HDL 81 10/06/2023   Lab Results  Component Value Date   LDLCALC 108 (H) 10/06/2023   Lab Results  Component Value Date   TRIG 55 10/06/2023   Lab Results  Component Value Date   CHOLHDL 2.5 10/06/2023   Lab Results  Component Value Date   HGBA1C 5.3 10/06/2023      Assessment & Plan:  Back pain without sciatica Assessment & Plan: Continue ibuprofen  800 mg every 8 hours as needed for pain. Encouraged nonpharmacological interventions including  low-impact exercise, core strengthening, stretching, hot/cold therapy, posture correction, weight management, and proper sleep positioning.   Orders: -     Urinalysis   Note: This chart has been completed using Dragon  Medical Dictation software, and while attempts have been made to ensure accuracy, certain words and phrases may not be transcribed as intended.   Follow-up: No follow-ups on file.   Shimeka Bacot, FNP

## 2023-12-02 NOTE — Patient Instructions (Addendum)
 I appreciate the opportunity to provide care to you today!   Please continue taking ibuprofen  800 mg every 8 hours as needed   Here are several nonpharmacological interventions for managing back pain: -Physical Activity: Engage in regular low-impact exercises such as walking, swimming, or cycling to strengthen the back muscles and improve flexibility. -Stretching and Strengthening Exercises: Incorporate exercises that focus on strengthening the core and back muscles, as well as stretches to improve flexibility. Specific exercises can include: Cat-Cow stretch Child's pose Knee-to-chest stretch Hamstring stretches Hot and Cold Therapy: -Cold Therapy: Apply ice packs to the affected area for 15-20 minutes to reduce inflammation and numb the pain, especially within the first 48 hours of an injury. -Heat Therapy: After the initial inflammation has subsided, use heat packs or warm baths to relax tight muscles and improve circulation. -Posture Improvement: Maintain proper posture while sitting, standing, and lifting to avoid additional strain on the back. Ergonomic chairs and lumbar support can help. -Weight Management: Maintain a healthy weight to reduce stress on the spine and lower back. -Sleep Hygiene: Ensure proper sleep positioning by using supportive pillows and mattresses to maintain spinal alignment during sleep.   Please follow up if your symptoms worsen or fail to improve.    Please continue to a heart-healthy diet and increase your physical activities. Try to exercise for at least five days a week.    It was a pleasure to see you and I look forward to continuing to work together on your health and well-being. Please do not hesitate to call the office if you need care or have questions about your care.  In case of emergency, please visit the Emergency Department for urgent care, or contact our clinic at [phone] to schedule an appointment. We're here to help you!   Have  a wonderful day and week. With Gratitude, Park Beck MSN, FNP-BC

## 2023-12-04 NOTE — Assessment & Plan Note (Signed)
 Continue ibuprofen  800 mg every 8 hours as needed for pain. Encouraged nonpharmacological interventions including low-impact exercise, core strengthening, stretching, hot/cold therapy, posture correction, weight management, and proper sleep positioning.

## 2023-12-06 LAB — URINALYSIS
Bilirubin, UA: NEGATIVE
Glucose, UA: NEGATIVE
Ketones, UA: NEGATIVE
Leukocytes,UA: NEGATIVE
Nitrite, UA: NEGATIVE
Protein,UA: NEGATIVE
Specific Gravity, UA: 1.02 (ref 1.005–1.030)
Urobilinogen, Ur: 0.2 mg/dL (ref 0.2–1.0)
pH, UA: 6 (ref 5.0–7.5)

## 2023-12-07 ENCOUNTER — Encounter: Payer: Self-pay | Admitting: Family Medicine

## 2023-12-08 ENCOUNTER — Other Ambulatory Visit: Payer: Self-pay | Admitting: Family Medicine

## 2023-12-08 ENCOUNTER — Ambulatory Visit: Payer: Self-pay | Admitting: Family Medicine

## 2023-12-08 DIAGNOSIS — M549 Dorsalgia, unspecified: Secondary | ICD-10-CM

## 2024-01-27 ENCOUNTER — Telehealth: Payer: Self-pay | Admitting: Orthopedic Surgery

## 2024-01-27 NOTE — Telephone Encounter (Signed)
 Talk to this patient on Friday today  She is having lower back pain and would just like an x-ray taken.  She understands that she will have to be sent to a spine surgeon for treatment

## 2024-01-30 ENCOUNTER — Encounter: Payer: Self-pay | Admitting: Orthopedic Surgery

## 2024-01-30 ENCOUNTER — Ambulatory Visit: Admitting: Orthopedic Surgery

## 2024-01-30 ENCOUNTER — Other Ambulatory Visit (INDEPENDENT_AMBULATORY_CARE_PROVIDER_SITE_OTHER): Payer: Self-pay

## 2024-01-30 ENCOUNTER — Other Ambulatory Visit: Payer: Self-pay

## 2024-01-30 VITALS — BP 116/86 | Ht 68.0 in | Wt 286.0 lb

## 2024-01-30 DIAGNOSIS — M5137 Other intervertebral disc degeneration, lumbosacral region with discogenic back pain only: Secondary | ICD-10-CM

## 2024-01-30 NOTE — Patient Instructions (Signed)
 Physical therapy has been ordered for you at [facility] in [place] They should call you to schedule, [phone] the phone number to call, if you want to call to schedule.

## 2024-01-30 NOTE — Progress Notes (Signed)
  Intake history:  BP 116/86 Comment: 11/2023  Ht 5' 8 (1.727 m)   Wt 286 lb (129.7 kg)   BMI 43.49 kg/m  Body mass index is 43.49 kg/m.    WHAT ARE WE SEEING YOU FOR TODAY?   back - lumbar/sacral  Radiation?: some right sided back pain .   Loss of bowel/urine control?  no  How long has this bothered you? (DOI?DOS?WS?)  For long duration has had some occasional pain with standing/ tightness with walking / now has gotten more frequent   States has been told in the past she has arthritis in the lumbar area  Anticoag.  No  Diabetes No  Heart disease No  Hypertension No  SMOKING HX No  Kidney disease No  Any ALLERGIES ______________________________________________   Treatment:  Have you taken:  Tylenol  Yes  Advil  Yes  Had PT No  Had injection No  Other  _________________________

## 2024-01-30 NOTE — Progress Notes (Signed)
 Patient ID: Gloria Carlson, female   DOB: Apr 15, 1981, 43 y.o.   MRN: [id number]  Office Visit Note   Patient: Gloria Carlson           Date of Birth: October 10, 1980           MRN: [id number] Visit Date: 01/30/2024 Requested by: Terry Wilhelmena Lloyd Hilario, FNP [id number] [address] [place],  [place] [address] PCP: Terry Wilhelmena Lloyd Hilario, FNP  Assessment & Plan:  Images personally read and my interpretation :   DG Lumbar Spine 2-3 Views Result Date: 01/30/2024 Images of the lumbar spine Lower back pain We see a slight abnormality in the coronal plane with a little bit of a shift towards the left.  We see normal lumbar lordosis She does have some lower lumbar facet arthritis Mild spondylosis lumbar spine     Visit Diagnoses:  1. Degeneration of intervertebral disc of lumbosacral region with discogenic back pain     Plan: PT , reassurance, education   Follow-Up Instructions: Return if symptoms worsen or fail to improve.   Orders:  No orders of the defined types were placed in this encounter.    Chief Complaint  Patient presents with   Back Pain    HPI Gloria Carlson is a 43 y.o. female.  Presents with a long history of lower back symptoms starting at the age of 16.  No specific trauma occur there has been no surgery or prior treatment  She complains of lower back pain increased when standing in 1 position causing her to alter her stance flexing the hip to relieve pain  She does have a history of greater trochanteric bursitis  Primarily gets lower back pain without radicular symptoms  Allergies  Allergen Reactions   Almond (Diagnostic) Hives and Swelling   Macrodantin [Nitrofurantoin Macrocrystal] Itching and Rash   Current Outpatient Medications  Medication Instructions   ALPRAZolam  (XANAX ) 1 mg, Oral, Daily PRN   azelastine  (ASTELIN ) 0.1 % nasal spray 1 spray, Each Nare, 2 times daily, Use in each nostril as directed   cetirizine (ZYRTEC) 10 mg, Daily   cyclobenzaprine   (FLEXERIL ) 5 mg, Oral, 2 times daily PRN   fluticasone  (FLONASE ) 50 MCG/ACT nasal spray 2 sprays, Each Nare, Daily   ibuprofen  (ADVIL ) 800 mg, Oral, Every 8 hours PRN   pantoprazole  (PROTONIX ) 40 mg, Oral, Daily   paragard intrauterine copper IUD IUD 1 each,  Once   Vitamin D  (Ergocalciferol ) (DRISDOL ) 50,000 Units, Oral, Every 7 days    Review of Systems Review of Systems  Constitutional:  Negative for fever and unexpected weight change.  Gastrointestinal: Negative.   Genitourinary: Negative.   Neurological:  Negative for numbness.    Past Medical History:  Diagnosis Date   Allergies    Anxiety    Asthma    as child    Past Surgical History:  Procedure Laterality Date   COLONOSCOPY WITH PROPOFOL  N/A 03/22/2022   Procedure: COLONOSCOPY WITH PROPOFOL ;  Surgeon: Cindie Carlin POUR, DO;  [facility];  Service: Endoscopy;  Laterality: N/A;  1:15 pm   None     POLYPECTOMY  03/22/2022   Procedure: POLYPECTOMY;  Surgeon: Cindie Carlin POUR, DO;  [facility];  Service: Endoscopy;;    Family History  Problem Relation Age of Onset   Hyperlipidemia Mother    Colon cancer Mother    Hypertension Father    was reviewed  Social History Social History   Tobacco Use   Smoking  status: Former   Smokeless 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 30 tablet 0   azelastine  (ASTELIN ) 0.1 % nasal spray Place 1 spray into both nostrils 2 (two) times daily. Use in each nostril as directed 30 mL 0   cetirizine (ZYRTEC) 10 MG tablet Take 10 mg by mouth daily.     cyclobenzaprine   (FLEXERIL ) 5 MG tablet Take 1 tablet (5 mg total) by mouth 2 (two) times daily as needed for muscle spasms. 60 tablet 2   fluticasone  (FLONASE ) 50 MCG/ACT nasal spray Place 2 sprays into both nostrils daily. 16 g 6   ibuprofen  (ADVIL ) 800 MG tablet Take 1 tablet (800 mg total) by mouth every 8 (eight) hours as needed for moderate pain (pain score 4-6). 90 tablet 0   pantoprazole  (PROTONIX ) 40 MG tablet Take 1 tablet (40 mg total) by mouth daily. 30 tablet 0   paragard intrauterine copper IUD IUD 1 each by Intrauterine route once.     Vitamin D , Ergocalciferol , (DRISDOL ) 1.25 MG (50000 UNIT) CAPS capsule Take 1 capsule (50,000 Units total) by mouth every 7 (seven) days. 7 capsule 0   No current facility-administered medications for this visit.     Physical Exam BP 116/86 Comment: 11/2023  Ht 5' 8 (1.727 m)   Wt 286 lb (129.7 kg)   BMI 43.49 kg/m   Gen. appearance: The patient is well-developed and well-nourished grooming and hygiene are normal The patient is oriented to person place and time The patient's mood is normal and the affect is normal   Gait assessment: The patient stands with  normal gait and station  Lumbar spine Tenderness  to palpation is noted in the lower L4-5 and 5 S1 segment  Range of motion normal with mild discomfort on extension Muscle tone normal on the right and left sides of the spine  Lower extremities  Normal range of motion hip    Strength right lower extremity L4, L5 Strength left lower extremity L4 L5  Neurologic right lower extremity examination  Reflexes were 2+   Sensation was normal     Straight leg raise testing negative  The vascular examination normal

## 2024-02-07 ENCOUNTER — Ambulatory Visit: Attending: Orthopedic Surgery | Admitting: Physical Therapy

## 2024-02-07 NOTE — Therapy (Deleted)
 OUTPATIENT PHYSICAL THERAPY EVALUATION   Patient Name: Gloria Carlson MRN: [id number] DOB:February 21, 1981, 43 y.o., female Today's Date: 02/07/2024  END OF SESSION:   Past Medical History:  Diagnosis Date   Allergies    Anxiety    Asthma    as child   Past Surgical History:  Procedure Laterality Date   COLONOSCOPY WITH PROPOFOL  N/A 03/22/2022   Procedure: COLONOSCOPY WITH PROPOFOL ;  Surgeon: Cindie Carlin POUR, DO;  [facility];  Service: Endoscopy;  Laterality: N/A;  1:15 pm   None     POLYPECTOMY  03/22/2022   Procedure: POLYPECTOMY;  Surgeon: Cindie Carlin POUR, DO;  [facility];  Service: Endoscopy;;   Patient Active Problem List   Diagnosis Date Noted   Lumbar pain 09/30/2023   Class 3 severe obesity due to excess calories with body mass index (BMI) of 40.0 to 44.9 in adult 09/30/2023   Rectal bleeding 03/04/2022   Internal hemorrhoids 03/04/2022   Family history of colon cancer 03/04/2022   Gastroesophageal reflux disease 03/04/2022   Dysphagia 03/04/2022   Constipation 03/04/2022   Other hemorrhoids 02/09/2022   Severe menstrual cramps 06/08/2021   Pelvic pain 06/08/2021   URI, acute 03/13/2021   Annual physical exam 10/22/2020   Medication management contract signed 06/09/2020   Back pain without sciatica 06/09/2020   BMI 40.0-44.9, adult (HCC) 06/09/2020   Abdominal obesity and metabolic syndrome 06/09/2020   Bee sting 02/04/2017   Anxiety 07/29/2016   Allergic rhinitis 07/29/2016   Herpes zoster without complication 07/26/2016    PCP: Del Wilhelmena Lloyd Sola, FNP   REFERRING PROVIDER: Margrette Taft BRAVO, MD   REFERRING DIAG: M51.370 (ICD-10-CM) - Degeneration of intervertebral disc of lumbosacral region with discogenic back pain  Rationale for Evaluation and Treatment:  Rehabiliation  THERAPY DIAG:  No diagnosis found.  ONSET DATE: ***   SUBJECTIVE:                                                                                                                                                                                            SUBJECTIVE STATEMENT: Gloria Carlson is a 43 y.o. female.  Presents with a long history of lower back symptoms starting at the age of 81.  No specific trauma occur there has been no surgery or prior treatment   PERTINENT HISTORY:  See above PMH  PAIN: *** NPRS scale: /10 upon arrival Pain location: Pain description: constant, achy, sharp Aggravating factors:  Relieving factors: rest, meds   PRECAUTIONS: ,  {Therapy precautions:24002}  RED FLAGS: {PT Red Flags:29287}   WEIGHT BEARING RESTRICTIONS:  {Yes ***/No:24003}  FALLS:  Has patient fallen in last 6 months? {fallsyesno:27318}   OCCUPATION:  ***  PLOF:  {PLOF:24004}  PATIENT GOALS:  ***  OBJECTIVE:  Note: Objective measures were completed at Evaluation unless otherwise noted.  DIAGNOSTIC FINDINGS:  Lumbar XR  We see a slight abnormality in the coronal plane with a little bit of a shift towards the left.  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 0 1 2 3 4 5 6 7 8 9  10 (Date and Score)   Activity Eval     1. ***      2. ***      3. ***    4.    5.    Score ***    Total score = sum of the activity scores/number of activities Minimum detectable change (90%CI) for average score = 2 points Minimum detectable change (90%CI) for single activity score = 3 points     EDEMA:  {Yes/No:[id number]}  POSTURE:  {posture:25561}  GAIT: Assistive device utilized: {Assistive devices:23999} Level of assistance: {Levels of assistance:24026} Comments: ***    {PT ROM TABLES:29304}  SPECIAL TESTS:  {PT Special Tests:29286}  FUNCTIONAL TESTS:  {Functional tests:24029}                                                                                                                               TREATMENT DATE:  Eval HEP creation and review with demonstration and trial set preformed, see below for details Selfcare:    PATIENT EDUCATION: Education details: HEP, PT plan of care, selfcare Person educated: Patient Education method: Explanation, Demonstration, Verbal cues, and Handouts Education comprehension: verbalized understanding, further education recommended   HOME EXERCISE PROGRAM: ***  ASSESSMENT:  CLINICAL IMPRESSION: Patient referred to PT for ***. Patient will benefit from skilled PT to improve overall function and to address impairments and limitations listed below.  OBJECTIVE IMPAIRMENTS: decreased activity tolerance for ADL's, difficulty walking, decreased balance, decreased endurance, decreased mobility, decreased ROM, decreased strength, impaired flexibility, impaired UE/LE use, and pain.  ACTIVITY LIMITATIONS: bending, liftting, walking, standing, cleaning, community activity, driving, reaching, carry, occupation  PERSONAL FACTORS: see above PMH  also affecting patient's functional outcome.  REHAB POTENTIAL: {rehabpotential:25112}  CLINICAL DECISION MAKING: {clinical decision making:25114}  EVALUATION COMPLEXITY: {Evaluation complexity:25115}    GOALS: Short term PT Goals Target date: *** Pt will be I and compliant with HEP. Baseline:  Goal status: New Pt will decrease pain by 25% overall Baseline: Goal status: New  Long term PT goals Target date:*** Pt will improve ROM to [facility] to improve functional mobility Baseline: Goal status: New Pt will improve  strength to at least 4+/5 MMT to improve functional strength Baseline: Goal status: New Pt will improve Patient specific functional scale (PSFS) to at least /10 to show  improved function level Baseline: Goal status: New Pt will reduce pain to overall less than 3/10 with usual activity and  work activity. Baseline: Goal status: New Pt will be able to ambulate community distances at least 1000 ft WNL gait pattern without complaints Baseline: Goal status: New  PLAN: PT FREQUENCY: 1-3 times per week   PT DURATION: 6-8 weeks  PLANNED INTERVENTIONS  {rehab planned interventions:25118::97110-Therapeutic exercises,97530- Therapeutic [phone]- Neuromuscular re-education,97535- Self Rjmz,02859- Manual therapy}  PLAN FOR NEXT SESSION: *** NEXT MD VISIT: PIERRETTE Redell JONELLE Maranda, PT 02/07/2024, 2:29 PM

## 2024-04-05 ENCOUNTER — Encounter: Payer: Self-pay | Admitting: Family Medicine

## 2024-04-05 ENCOUNTER — Ambulatory Visit: Admitting: Family Medicine

## 2024-04-05 VITALS — BP 127/85 | HR 98 | Resp 18 | Ht 66.0 in | Wt 283.1 lb

## 2024-04-05 DIAGNOSIS — E7849 Other hyperlipidemia: Secondary | ICD-10-CM

## 2024-04-05 DIAGNOSIS — R7301 Impaired fasting glucose: Secondary | ICD-10-CM | POA: Diagnosis not present

## 2024-04-05 DIAGNOSIS — Z23 Encounter for immunization: Secondary | ICD-10-CM

## 2024-04-05 DIAGNOSIS — E559 Vitamin D deficiency, unspecified: Secondary | ICD-10-CM

## 2024-04-05 DIAGNOSIS — F419 Anxiety disorder, unspecified: Secondary | ICD-10-CM

## 2024-04-05 DIAGNOSIS — R002 Palpitations: Secondary | ICD-10-CM

## 2024-04-05 DIAGNOSIS — E038 Other specified hypothyroidism: Secondary | ICD-10-CM

## 2024-04-05 MED ORDER — ALPRAZOLAM 1 MG PO TABS: 1.0000 mg | ORAL_TABLET | Freq: Every day | ORAL | 0 refills | Status: AC | PRN

## 2024-04-05 NOTE — Assessment & Plan Note (Signed)
 Denies SI/HI and AVH Refilled xanax 

## 2024-04-05 NOTE — Assessment & Plan Note (Signed)
 Patient educated on CDC recommendation for the vaccine. Verbal consent was obtained from the patient, vaccine administered by nurse, no sign of adverse reactions noted at this time. Patient education on arm soreness and use of tylenol or ibuprofen for this patient  was discussed. Patient educated on the signs and symptoms of adverse effect and advise to contact the office if they occur.

## 2024-04-05 NOTE — Assessment & Plan Note (Signed)
 EKG with normal sinus rhythm  Encouraged to limit caffeine intake, as this can contribute to palpitations. Encouraged to seek urgent care if palpitations are accompanied by chest pain, shortness of breath, dizziness, fainting, or severe fatigue.

## 2024-04-05 NOTE — Patient Instructions (Addendum)
 I appreciate the opportunity to provide care to you today!    Follow up:  4 months  Fasting Labs: please stop by the lab during the week to get your blood drawn (CBC, CMP, TSH, Lipid profile, HgA1c, Vit D)  Palpations Limit caffeine intake, as this can contribute to palpitations. Please seek urgent care if palpitations are accompanied by chest pain, shortness of breath, dizziness, fainting, or severe fatigue.  For a Healthier YOU, I Recommend: Reducing your intake of sugar, sodium, carbohydrates, and saturated fats. Increasing your fiber intake by incorporating more whole grains, fruits, and vegetables into your meals. Setting healthy goals with a focus on lowering your consumption of carbs, sugar, and unhealthy fats. Adding variety to your diet by including a wide range of fruits and vegetables. Cutting back on soda and limiting processed foods as much as possible. Staying active: In addition to taking your weight loss medication, aim for at least 150 minutes of moderate-intensity physical activity each week for optimal results.   Please follow up if your symptoms worsen or fail to improve.     Please continue to a heart-healthy diet and increase your physical activities. Try to exercise for at least five days a week.    It was a pleasure to see you and I look forward to continuing to work together on your health and well-being. Please do not hesitate to call the office if you need care or have questions about your care.  In case of emergency, please visit the Emergency Department for urgent care, or contact our clinic at [phone] to schedule an appointment. We're here to help you!   Have a wonderful day and week. With Gratitude, Meade JENEANE Gerlach MSN, FNP-BC, PMHNP-BC

## 2024-04-05 NOTE — Progress Notes (Signed)
 Established Patient Office Visit  Subjective:  Patient ID: Gloria Carlson, female    DOB: 30-Mar-1981  Age: 43 y.o. MRN: [id number]  CC:  Chief Complaint  Patient presents with   Medical Management of Chronic Issues    6 month follow up    Palpitations    Pt complains of heart palpitations started last month. Denies chest pain or sob during palpitations     HPI Gloria Carlson is a 43 y.o. female with a past medical history of anxiety and chronic lumbar pain, presents for follow-up of chronic medical conditions and the above complaints. She reports experiencing palpitations while walking, which lasted approximately 5-6 minutes. She has had similar episodes in the past that lasted only a few seconds before resolving. The palpitations occur suddenly without clear triggers. The patient reports drinking coffee every morning and is a daily caffeine consumer. She denies chest pain, shortness of breath, dizziness, or syncope associated with the episodes.    Past Medical History:  Diagnosis Date   Allergies    Anxiety    Asthma    as child    Past Surgical History:  Procedure Laterality Date   COLONOSCOPY WITH PROPOFOL  N/A 03/22/2022   Procedure: COLONOSCOPY WITH PROPOFOL ;  Surgeon: Cindie Carlin POUR, DO;  Location: [facility];  Service: Endoscopy;  Laterality: N/A;  1:15 pm   None     POLYPECTOMY  03/22/2022   Procedure: POLYPECTOMY;  Surgeon: Cindie Carlin POUR, DO;  Location: [facility];  Service: Endoscopy;;    Family History  Problem Relation Age of Onset   Hyperlipidemia Mother    Colon cancer Mother    Hypertension Father     Social History   Socioeconomic History   Marital status: Legally Separated    Spouse name: Not on file   Number of children: 1   Years of education: Not on file   Highest education level: Some college, no degree  Occupational History   Occupation: [employer]  Tobacco Use   Smoking status: Former   Smokeless tobacco: Never   Advertising account planner   Vaping status: Never Used  Substance and Sexual Activity   Alcohol use: Yes    Comment: occ   Drug use: No   Sexual activity: Yes  Other Topics Concern   Not on file  Social History Narrative   Not on file   Social Drivers of Health   Financial Resource Strain: Not on file  Food Insecurity: Not on file  Transportation Needs: Not on file  Physical Activity: Not on file  Stress: Not on file  Social Connections: Not on file  Intimate Partner Violence: Not At Risk (07/08/2021)   Received [facility]   Humiliation, Afraid, Rape, and Kick questionnaire    Within the last year, have you been afraid of your partner or ex-partner?: No    Within the last year, have you been humiliated or emotionally abused in other ways by your partner or ex-partner?: No    Within the last year, have you been kicked, hit, slapped, or otherwise physically hurt by your partner or ex-partner?: No    Within the last year, have you been raped or forced to have any kind of sexual activity by your partner or ex-partner?: No    Outpatient Medications Prior to Visit  Medication Sig Dispense Refill   azelastine  (ASTELIN ) 0.1 % nasal spray Place 1 spray into both nostrils 2 (two) times daily. Use in each nostril as  directed 30 mL 0   cetirizine (ZYRTEC) 10 MG tablet Take 10 mg by mouth daily.     cyclobenzaprine  (FLEXERIL ) 5 MG tablet Take 1 tablet (5 mg total) by mouth 2 (two) times daily as needed for muscle spasms. 60 tablet 2   fluticasone  (FLONASE ) 50 MCG/ACT nasal spray Place 2 sprays into both nostrils daily. 16 g 6   ibuprofen  (ADVIL ) 800 MG tablet Take 1 tablet (800 mg total) by mouth every 8 (eight) hours as needed for moderate pain (pain score 4-6). 90 tablet 0   pantoprazole  (PROTONIX ) 40 MG tablet Take 1 tablet (40 mg total) by mouth daily. 30 tablet 0   paragard intrauterine copper IUD IUD 1 each by Intrauterine route once.     Vitamin D , Ergocalciferol , (DRISDOL ) 1.25 MG (50000  UNIT) CAPS capsule Take 1 capsule (50,000 Units total) by mouth every 7 (seven) days. 7 capsule 0   ALPRAZolam  (XANAX ) 1 MG tablet Take 1 tablet (1 mg total) by mouth daily as needed for anxiety. 30 tablet 0   No facility-administered medications prior to visit.    Allergies  Allergen Reactions   Almond (Diagnostic) Hives and Swelling   Macrodantin [Nitrofurantoin Macrocrystal] Itching and Rash    ROS Review of Systems  Constitutional:  Negative for chills and fever.  Eyes:  Negative for visual disturbance.  Respiratory:  Negative for cough, choking, chest tightness, shortness of breath, wheezing and stridor.   Cardiovascular:  Positive for palpitations. Negative for chest pain and leg swelling.  Neurological:  Negative for dizziness and headaches.      Objective:    Physical Exam HENT:     Head: Normocephalic.     Mouth/Throat:     Mouth: Mucous membranes are moist.  Cardiovascular:     Rate and Rhythm: Normal rate.     Heart sounds: Normal heart sounds.  Pulmonary:     Effort: Pulmonary effort is normal.     Breath sounds: Normal breath sounds.  Neurological:     Mental Status: She is alert.     BP 127/85   Pulse 98   Resp 18   Ht 5' 6 (1.676 m)   Wt 283 lb 1.9 oz (128.4 kg)   SpO2 97%   BMI 45.70 kg/m  Wt Readings from Last 3 Encounters:  04/05/24 283 lb 1.9 oz (128.4 kg)  01/30/24 286 lb (129.7 kg)  12/02/23 287 lb (130.2 kg)    Lab Results  Component Value Date   TSH 1.470 10/06/2023   Lab Results  Component Value Date   WBC 7.4 10/06/2023   HGB 11.2 10/06/2023   HCT 35.0 10/06/2023   MCV 81 10/06/2023   PLT 315 10/06/2023   Lab Results  Component Value Date   NA 138 10/06/2023   K 4.5 10/06/2023   CO2 23 10/06/2023   GLUCOSE 87 10/06/2023   BUN 11 10/06/2023   CREATININE 1.02 (H) 10/06/2023   BILITOT 0.3 10/06/2023   ALKPHOS 86 10/06/2023   AST 9 10/06/2023   ALT 10 10/06/2023   PROT 6.5 10/06/2023   ALBUMIN 3.9 10/06/2023    CALCIUM 9.0 10/06/2023   EGFR 70 10/06/2023   Lab Results  Component Value Date   CHOL 199 10/06/2023   Lab Results  Component Value Date   HDL 81 10/06/2023   Lab Results  Component Value Date   LDLCALC 108 (H) 10/06/2023   Lab Results  Component Value Date   TRIG 55 10/06/2023   Lab  Results  Component Value Date   CHOLHDL 2.5 10/06/2023   Lab Results  Component Value Date   HGBA1C 5.3 10/06/2023      Assessment & Plan:  Palpitation Assessment & Plan: EKG with normal sinus rhythm  Encouraged to limit caffeine intake, as this can contribute to palpitations. Encouraged to seek urgent care if palpitations are accompanied by chest pain, shortness of breath, dizziness, fainting, or severe fatigue.  Orders: -     EKG 12-Lead  Encounter for immunization Assessment & Plan: Patient educated on CDC recommendation for the vaccine. Verbal consent was obtained from the patient, vaccine administered by nurse, no sign of adverse reactions noted at this time. Patient education on arm soreness and use of tylenol  or ibuprofen  for this patient  was discussed. Patient educated on the signs and symptoms of adverse effect and advise to contact the office if they occur.   Orders: -     Flu vaccine trivalent PF, 6mos and older(Flulaval,Afluria,Fluarix,Fluzone)  Anxiety Assessment & Plan: Denies SI/HI and AVH Refilled xanax   Orders: -     ALPRAZolam ; Take 1 tablet (1 mg total) by mouth daily as needed for anxiety.  Dispense: 30 tablet; Refill: 0  IFG (impaired fasting glucose) -     Hemoglobin A1c  Vitamin D  deficiency -     VITAMIN D  25 Hydroxy (Vit-D Deficiency, Fractures)  TSH (thyroid-stimulating hormone deficiency) -     TSH + free T4  Other hyperlipidemia -     Lipid panel -     CMP14+EGFR -     CBC with Differential/Platelet   Note: This chart has been completed using Engineer, civil (consulting) software, and while attempts have been made to ensure accuracy, certain  words and phrases may not be transcribed as intended.   Follow-up: Return in about 4 months (around 08/06/2024).   Jedadiah Abdallah  Z Bacchus, FNP
